# Patient Record
Sex: Female | Born: 1960 | Race: White | Hispanic: No | Smoking: Never smoker
Health system: Southern US, Community
[De-identification: ages and names within clinical notes are randomized; demographics above are authoritative.]

## PROBLEM LIST (undated history)

## (undated) DIAGNOSIS — I10 Essential (primary) hypertension: Secondary | ICD-10-CM

## (undated) DIAGNOSIS — G43909 Migraine, unspecified, not intractable, without status migrainosus: Secondary | ICD-10-CM

## (undated) DIAGNOSIS — H8109 Meniere's disease, unspecified ear: Secondary | ICD-10-CM

## (undated) DIAGNOSIS — J449 Chronic obstructive pulmonary disease, unspecified: Secondary | ICD-10-CM

## (undated) HISTORY — PX: TONSILLECTOMY: SUR1361

## (undated) HISTORY — PX: APPENDECTOMY: SHX54

---

## 2013-04-04 ENCOUNTER — Other Ambulatory Visit (HOSPITAL_COMMUNITY): Payer: Self-pay | Admitting: Family Medicine

## 2013-04-04 DIAGNOSIS — Z139 Encounter for screening, unspecified: Secondary | ICD-10-CM

## 2013-04-16 ENCOUNTER — Ambulatory Visit (HOSPITAL_COMMUNITY)
Admission: RE | Admit: 2013-04-16 | Discharge: 2013-04-16 | Disposition: A | Discharge status: Home / Self Care | Payer: BC Managed Care – PPO | Source: Ambulatory Visit | Attending: Family Medicine | Admitting: Family Medicine

## 2013-04-16 DIAGNOSIS — Z1231 Encounter for screening mammogram for malignant neoplasm of breast: Secondary | ICD-10-CM | POA: Insufficient documentation

## 2013-04-16 DIAGNOSIS — Z139 Encounter for screening, unspecified: Secondary | ICD-10-CM

## 2013-04-23 ENCOUNTER — Other Ambulatory Visit (HOSPITAL_COMMUNITY): Payer: Self-pay | Admitting: Family Medicine

## 2013-04-23 DIAGNOSIS — M7989 Other specified soft tissue disorders: Secondary | ICD-10-CM

## 2013-04-24 ENCOUNTER — Ambulatory Visit (HOSPITAL_COMMUNITY): Payer: BC Managed Care – PPO

## 2013-04-29 ENCOUNTER — Ambulatory Visit (HOSPITAL_COMMUNITY)
Admission: RE | Admit: 2013-04-29 | Discharge: 2013-04-29 | Disposition: A | Discharge status: Home / Self Care | Payer: BC Managed Care – PPO | Source: Ambulatory Visit | Attending: Family Medicine | Admitting: Family Medicine

## 2013-04-29 DIAGNOSIS — M7989 Other specified soft tissue disorders: Secondary | ICD-10-CM | POA: Insufficient documentation

## 2013-08-01 DIAGNOSIS — H8109 Meniere's disease, unspecified ear: Secondary | ICD-10-CM

## 2013-08-01 HISTORY — DX: Meniere's disease, unspecified ear: H81.09

## 2014-01-02 ENCOUNTER — Encounter (INDEPENDENT_AMBULATORY_CARE_PROVIDER_SITE_OTHER): Payer: BC Managed Care – PPO | Admitting: Ophthalmology

## 2014-01-02 DIAGNOSIS — H33309 Unspecified retinal break, unspecified eye: Secondary | ICD-10-CM

## 2014-01-02 DIAGNOSIS — I10 Essential (primary) hypertension: Secondary | ICD-10-CM

## 2014-01-02 DIAGNOSIS — H35419 Lattice degeneration of retina, unspecified eye: Secondary | ICD-10-CM

## 2014-01-02 DIAGNOSIS — H43819 Vitreous degeneration, unspecified eye: Secondary | ICD-10-CM

## 2014-01-02 DIAGNOSIS — H35039 Hypertensive retinopathy, unspecified eye: Secondary | ICD-10-CM

## 2014-01-09 ENCOUNTER — Encounter (INDEPENDENT_AMBULATORY_CARE_PROVIDER_SITE_OTHER): Payer: BC Managed Care – PPO | Admitting: Ophthalmology

## 2014-01-09 DIAGNOSIS — H33309 Unspecified retinal break, unspecified eye: Secondary | ICD-10-CM

## 2014-01-23 ENCOUNTER — Ambulatory Visit (INDEPENDENT_AMBULATORY_CARE_PROVIDER_SITE_OTHER): Payer: BC Managed Care – PPO | Admitting: Ophthalmology

## 2014-01-29 ENCOUNTER — Ambulatory Visit (INDEPENDENT_AMBULATORY_CARE_PROVIDER_SITE_OTHER): Payer: BC Managed Care – PPO | Admitting: Ophthalmology

## 2014-01-29 DIAGNOSIS — H33309 Unspecified retinal break, unspecified eye: Secondary | ICD-10-CM

## 2014-02-14 ENCOUNTER — Emergency Department (HOSPITAL_COMMUNITY): Payer: BC Managed Care – PPO

## 2014-02-14 ENCOUNTER — Encounter (HOSPITAL_COMMUNITY): Payer: Self-pay | Admitting: Emergency Medicine

## 2014-02-14 ENCOUNTER — Emergency Department (HOSPITAL_COMMUNITY)
Admission: EM | Admit: 2014-02-14 | Discharge: 2014-02-14 | Disposition: A | Discharge status: Home / Self Care | Payer: BC Managed Care – PPO | Attending: Emergency Medicine | Admitting: Emergency Medicine

## 2014-02-14 DIAGNOSIS — Z8669 Personal history of other diseases of the nervous system and sense organs: Secondary | ICD-10-CM | POA: Insufficient documentation

## 2014-02-14 DIAGNOSIS — R0602 Shortness of breath: Secondary | ICD-10-CM | POA: Insufficient documentation

## 2014-02-14 DIAGNOSIS — R42 Dizziness and giddiness: Secondary | ICD-10-CM | POA: Diagnosis present

## 2014-02-14 DIAGNOSIS — I10 Essential (primary) hypertension: Secondary | ICD-10-CM | POA: Insufficient documentation

## 2014-02-14 HISTORY — DX: Essential (primary) hypertension: I10

## 2014-02-14 HISTORY — DX: Migraine, unspecified, not intractable, without status migrainosus: G43.909

## 2014-02-14 LAB — CBC WITH DIFFERENTIAL/PLATELET
BASOS PCT: 1 % (ref 0–1)
Basophils Absolute: 0 10*3/uL (ref 0.0–0.1)
EOS ABS: 0.1 10*3/uL (ref 0.0–0.7)
EOS PCT: 2 % (ref 0–5)
HCT: 40.2 % (ref 36.0–46.0)
Hemoglobin: 13.2 g/dL (ref 12.0–15.0)
LYMPHS ABS: 1.4 10*3/uL (ref 0.7–4.0)
Lymphocytes Relative: 25 % (ref 12–46)
MCH: 27.4 pg (ref 26.0–34.0)
MCHC: 32.8 g/dL (ref 30.0–36.0)
MCV: 83.4 fL (ref 78.0–100.0)
Monocytes Absolute: 0.4 10*3/uL (ref 0.1–1.0)
Monocytes Relative: 7 % (ref 3–12)
NEUTROS PCT: 65 % (ref 43–77)
Neutro Abs: 3.7 10*3/uL (ref 1.7–7.7)
PLATELETS: 289 10*3/uL (ref 150–400)
RBC: 4.82 MIL/uL (ref 3.87–5.11)
RDW: 12.9 % (ref 11.5–15.5)
WBC: 5.6 10*3/uL (ref 4.0–10.5)

## 2014-02-14 LAB — COMPREHENSIVE METABOLIC PANEL
ALBUMIN: 4.3 g/dL (ref 3.5–5.2)
ALT: 17 U/L (ref 0–35)
ANION GAP: 13 (ref 5–15)
AST: 19 U/L (ref 0–37)
Alkaline Phosphatase: 151 U/L — ABNORMAL HIGH (ref 39–117)
BUN: 16 mg/dL (ref 6–23)
CALCIUM: 9.6 mg/dL (ref 8.4–10.5)
CO2: 28 mEq/L (ref 19–32)
Chloride: 100 mEq/L (ref 96–112)
Creatinine, Ser: 0.84 mg/dL (ref 0.50–1.10)
GFR calc Af Amer: >90 mL/min (ref >90)
GFR calc non Af Amer: 79 mL/min — ABNORMAL LOW (ref >90)
GLUCOSE: 110 mg/dL — AB (ref 70–99)
Potassium: 3.9 mEq/L (ref 3.7–5.3)
SODIUM: 141 meq/L (ref 137–147)
TOTAL PROTEIN: 7.5 g/dL (ref 6.0–8.3)
Total Bilirubin: 0.4 mg/dL (ref 0.3–1.2)

## 2014-02-14 LAB — TROPONIN I: Troponin I: <0.3 ng/mL (ref <0.30)

## 2014-02-14 MED ORDER — MECLIZINE HCL 12.5 MG PO TABS
25.0000 mg | ORAL_TABLET | Freq: Once | ORAL | Status: AC
  Administered 2014-02-14: 25 mg via ORAL
  Filled 2014-02-14: qty 2

## 2014-02-14 MED ORDER — MECLIZINE HCL 25 MG PO TABS: ORAL_TABLET | ORAL | Status: DC

## 2014-02-14 MED ORDER — SODIUM CHLORIDE 0.9 % IV BOLUS (SEPSIS)
500.0000 mL | Freq: Once | INTRAVENOUS | Status: AC
  Administered 2014-02-14: 500 mL via INTRAVENOUS

## 2014-02-14 NOTE — Discharge Instructions (Signed)
Follow up with your md next week for recheck °

## 2014-02-14 NOTE — ED Provider Notes (Signed)
CSN: [id number]     Arrival date & time 02/14/14  1614 History   First MD Initiated Contact with Patient 02/14/14 1622   This chart was scribed for Patricia Lennert, MD by Gwenevere Abbot, ED scribe. This patient was seen in room APA03/APA03 and the patient's care was started at 4:26 PM.     Chief Complaint  Patient presents with  . Dizziness   Patient is a 53 y.o. female presenting with dizziness. The history is provided by the patient. No language interpreter was used.  Dizziness Quality:  Room spinning Severity:  Severe Duration:  4 hours Context: standing up   Relieved by: sitting. Associated symptoms: no chest pain, no diarrhea and no headaches    HPI Comments:  Patricia Keith is a 53 y.o. female who presents to the Emergency Department complaining of dizziness with associated symptoms of SOB, onset 12:00PM. Pt also complains of intermitent numbness and tingling in both arms for the past two weeks. Pt complains that she feels like the room is spinning, but sitting down aides in relieving these symptoms. Pt reports that sometimes there is pain when she takes a deep breath. Pt reports that she has only experienced such dizziness when her BP is high. Pt states that she had laser eye surgery in the left and right eye 12/2013. Pt reports that she also has symptoms of allergies.  Past Medical History  Diagnosis Date  . Hypertension   . Migraines    Past Surgical History  Procedure Laterality Date  . Appendectomy    . Tonsillectomy     History reviewed. No pertinent family history. History  Substance Use Topics  . Smoking status: Never Smoker   . Smokeless tobacco: Not on file  . Alcohol Use: Yes   OB History   Grav Para Term Preterm Abortions TAB SAB Ect Mult Living                 Review of Systems  Constitutional: Negative for appetite change and fatigue.  HENT: Negative for congestion, ear discharge and sinus pressure.   Eyes: Negative for discharge.  Respiratory: Negative  for cough.   Cardiovascular: Negative for chest pain.  Gastrointestinal: Negative for abdominal pain and diarrhea.  Genitourinary: Negative for frequency and hematuria.  Musculoskeletal: Negative for back pain.  Skin: Negative for rash.  Neurological: Positive for dizziness. Negative for seizures and headaches.  Psychiatric/Behavioral: Negative for hallucinations.      Allergies  Review of patient's allergies indicates no known allergies.  Home Medications   Prior to Admission medications   Not on File   BP 148/75  Pulse 72  Temp(Src) 98.1 F (36.7 C) (Oral)  Resp 15  Ht 5\' 6"  (1.676 m)  Wt 175 lb (79.379 kg)  BMI 28.26 kg/m2  SpO2 100% Physical Exam  Constitutional: She is oriented to person, place, and time. She appears well-developed.  HENT:  Head: Normocephalic.  Eyes: Conjunctivae and EOM are normal. No scleral icterus.  Neck: Neck supple. No thyromegaly present.  Cardiovascular: Normal rate and regular rhythm.  Exam reveals no gallop and no friction rub.   No murmur heard. Pulmonary/Chest: No stridor. She has no wheezes. She has no rales. She exhibits no tenderness.  Abdominal: She exhibits no distension. There is no tenderness. There is no rebound.  Musculoskeletal: Normal range of motion. She exhibits no edema.  Lymphadenopathy:    She has no cervical adenopathy.  Neurological: She is oriented to person, place, and time. She  exhibits normal muscle tone. Coordination normal.  Skin: No rash noted. No erythema.  Psychiatric: She has a normal mood and affect. Her behavior is normal.    ED Course  Procedures  DIAGNOSTIC STUDIES: Oxygen Saturation is 100% on RA, normal by my interpretation.  COORDINATION OF CARE: 4:30 PM-Discussed treatment plan with pt at bedside and pt agreed to plan. Results for orders placed during the hospital encounter of 02/14/14  CBC WITH DIFFERENTIAL      Result Value Ref Range   WBC 5.6  4.0 - 10.5 K/uL   RBC 4.82  3.87 - 5.11  MIL/uL   Hemoglobin 13.2  12.0 - 15.0 g/dL   HCT 91.440.2  78.236.0 - 95.646.0 %   MCV 83.4  78.0 - 100.0 fL   MCH 27.4  26.0 - 34.0 pg   MCHC 32.8  30.0 - 36.0 g/dL   RDW 21.312.9  08.611.5 - 57.815.5 %   Platelets 289  150 - 400 K/uL   Neutrophils Relative % 65  43 - 77 %   Neutro Abs 3.7  1.7 - 7.7 K/uL   Lymphocytes Relative 25  12 - 46 %   Lymphs Abs 1.4  0.7 - 4.0 K/uL   Monocytes Relative 7  3 - 12 %   Monocytes Absolute 0.4  0.1 - 1.0 K/uL   Eosinophils Relative 2  0 - 5 %   Eosinophils Absolute 0.1  0.0 - 0.7 K/uL   Basophils Relative 1  0 - 1 %   Basophils Absolute 0.0  0.0 - 0.1 K/uL  COMPREHENSIVE METABOLIC PANEL      Result Value Ref Range   Sodium 141  137 - 147 mEq/L   Potassium 3.9  3.7 - 5.3 mEq/L   Chloride 100  96 - 112 mEq/L   CO2 28  19 - 32 mEq/L   Glucose, Bld 110 (*) 70 - 99 mg/dL   BUN 16  6 - 23 mg/dL   Creatinine, Ser 4.690.84  0.50 - 1.10 mg/dL   Calcium 9.6  8.4 - 62.910.5 mg/dL   Total Protein 7.5  6.0 - 8.3 g/dL   Albumin 4.3  3.5 - 5.2 g/dL   AST 19  0 - 37 U/L   ALT 17  0 - 35 U/L   Alkaline Phosphatase 151 (*) 39 - 117 U/L   Total Bilirubin 0.4  0.3 - 1.2 mg/dL   GFR calc non Af Amer 79 (*) >90 mL/min   GFR calc Af Amer >90  >90 mL/min   Anion gap 13  5 - 15  TROPONIN I      Result Value Ref Range   Troponin I <0.30  <0.30 ng/mL      EKG Interpretation   Date/Time:  Friday February 14 2014 16:29:28 EDT Ventricular Rate:  71 PR Interval:  176 QRS Duration: 85 QT Interval:  438 QTC Calculation: 476 R Axis:   75 Text Interpretation:  Sinus rhythm Low voltage, precordial leads Probable  anteroseptal infarct, old Confirmed by Roniqua Kintz  MD, Kaila Devries [phone]([id number]) on  02/14/2014 6:12:00 PM      MDM   Final diagnoses:  None   Vertigo,  tx with antivert  The chart was scribed for me under my direct supervision.  I personally performed the history, physical, and medical decision making and all procedures in the evaluation of this patient.Patricia Keith.    Kerianna Rawlinson L Laiah Pouncey, MD 02/14/14  [phone]

## 2014-02-14 NOTE — ED Notes (Signed)
Pt states at 1200 she started feeling dizzy, with SOB, nausea, pt also states for past few weeks both arms feel numb off and on. Denies chest pain. States her head is starting to hurt.

## 2014-05-15 ENCOUNTER — Ambulatory Visit (INDEPENDENT_AMBULATORY_CARE_PROVIDER_SITE_OTHER): Payer: BC Managed Care – PPO | Admitting: Otolaryngology

## 2014-05-15 DIAGNOSIS — R42 Dizziness and giddiness: Secondary | ICD-10-CM

## 2014-05-15 DIAGNOSIS — H8101 Meniere's disease, right ear: Secondary | ICD-10-CM

## 2014-05-20 ENCOUNTER — Other Ambulatory Visit (HOSPITAL_COMMUNITY): Payer: Self-pay | Admitting: Respiratory Therapy

## 2014-05-20 DIAGNOSIS — R0602 Shortness of breath: Secondary | ICD-10-CM

## 2014-06-02 ENCOUNTER — Ambulatory Visit (HOSPITAL_COMMUNITY)
Admission: RE | Admit: 2014-06-02 | Discharge: 2014-06-02 | Disposition: A | Discharge status: Home / Self Care | Payer: BC Managed Care – PPO | Source: Ambulatory Visit | Attending: Family Medicine | Admitting: Family Medicine

## 2014-06-02 ENCOUNTER — Ambulatory Visit (INDEPENDENT_AMBULATORY_CARE_PROVIDER_SITE_OTHER): Payer: BC Managed Care – PPO | Admitting: Ophthalmology

## 2014-06-02 DIAGNOSIS — H35033 Hypertensive retinopathy, bilateral: Secondary | ICD-10-CM

## 2014-06-02 DIAGNOSIS — R0602 Shortness of breath: Secondary | ICD-10-CM | POA: Diagnosis not present

## 2014-06-02 DIAGNOSIS — H33303 Unspecified retinal break, bilateral: Secondary | ICD-10-CM

## 2014-06-02 DIAGNOSIS — H43813 Vitreous degeneration, bilateral: Secondary | ICD-10-CM

## 2014-06-02 DIAGNOSIS — H35413 Lattice degeneration of retina, bilateral: Secondary | ICD-10-CM

## 2014-06-02 DIAGNOSIS — H2512 Age-related nuclear cataract, left eye: Secondary | ICD-10-CM

## 2014-06-02 DIAGNOSIS — I10 Essential (primary) hypertension: Secondary | ICD-10-CM

## 2014-06-02 LAB — PULMONARY FUNCTION TEST
DL/VA % pred: 83 %
DL/VA: 4.21 ml/min/mmHg/L
DLCO UNC % PRED: 70 %
DLCO unc: 18.92 ml/min/mmHg
FEF 25-75 PRE: 2.15 L/s
FEF 25-75 Post: 2.43 L/sec
FEF2575-%Change-Post: 12 %
FEF2575-%Pred-Post: 87 %
FEF2575-%Pred-Pre: 77 %
FEV1-%CHANGE-POST: 3 %
FEV1-%Pred-Post: 82 %
FEV1-%Pred-Pre: 80 %
FEV1-Post: 2.43 L
FEV1-Pre: 2.35 L
FEV1FVC-%Change-Post: 2 %
FEV1FVC-%Pred-Pre: 97 %
FEV6-%Change-Post: 1 %
FEV6-%PRED-PRE: 82 %
FEV6-%Pred-Post: 83 %
FEV6-POST: 3.04 L
FEV6-Pre: 3.01 L
FEV6FVC-%PRED-POST: 103 %
FEV6FVC-%PRED-PRE: 103 %
FVC-%Change-Post: 1 %
FVC-%PRED-PRE: 80 %
FVC-%Pred-Post: 81 %
FVC-POST: 3.04 L
FVC-PRE: 3.01 L
POST FEV6/FVC RATIO: 100 %
PRE FEV6/FVC RATIO: 100 %
Post FEV1/FVC ratio: 80 %
Pre FEV1/FVC ratio: 78 %
RV % pred: 114 %
RV: 2.22 L
TLC % PRED: 100 %
TLC: 5.38 L

## 2014-06-02 MED ORDER — ALBUTEROL SULFATE (2.5 MG/3ML) 0.083% IN NEBU
2.5000 mg | INHALATION_SOLUTION | Freq: Once | RESPIRATORY_TRACT | Status: AC
  Administered 2014-06-02: 2.5 mg via RESPIRATORY_TRACT

## 2014-06-12 ENCOUNTER — Ambulatory Visit (INDEPENDENT_AMBULATORY_CARE_PROVIDER_SITE_OTHER): Payer: BC Managed Care – PPO | Admitting: Otolaryngology

## 2014-06-12 DIAGNOSIS — H8103 Meniere's disease, bilateral: Secondary | ICD-10-CM

## 2014-06-12 DIAGNOSIS — H93293 Other abnormal auditory perceptions, bilateral: Secondary | ICD-10-CM

## 2014-07-29 ENCOUNTER — Ambulatory Visit (HOSPITAL_COMMUNITY)
Admission: RE | Admit: 2014-07-29 | Discharge: 2014-07-29 | Disposition: A | Discharge status: Home / Self Care | Payer: BC Managed Care – PPO | Source: Ambulatory Visit | Attending: Physician Assistant | Admitting: Physician Assistant

## 2014-07-29 ENCOUNTER — Other Ambulatory Visit (HOSPITAL_COMMUNITY): Payer: Self-pay | Admitting: Physician Assistant

## 2014-07-29 DIAGNOSIS — R059 Cough, unspecified: Secondary | ICD-10-CM

## 2014-07-29 DIAGNOSIS — R0600 Dyspnea, unspecified: Secondary | ICD-10-CM | POA: Insufficient documentation

## 2014-07-29 DIAGNOSIS — R05 Cough: Secondary | ICD-10-CM

## 2014-09-18 ENCOUNTER — Ambulatory Visit (INDEPENDENT_AMBULATORY_CARE_PROVIDER_SITE_OTHER): Payer: 59 | Admitting: Otolaryngology

## 2014-09-18 DIAGNOSIS — H8103 Meniere's disease, bilateral: Secondary | ICD-10-CM

## 2014-09-18 DIAGNOSIS — H903 Sensorineural hearing loss, bilateral: Secondary | ICD-10-CM

## 2015-06-08 ENCOUNTER — Ambulatory Visit (HOSPITAL_COMMUNITY)
Admission: RE | Admit: 2015-06-08 | Discharge: 2015-06-08 | Disposition: A | Discharge status: Home / Self Care | Payer: 59 | Source: Ambulatory Visit | Attending: Family Medicine | Admitting: Family Medicine

## 2015-06-08 ENCOUNTER — Other Ambulatory Visit (HOSPITAL_COMMUNITY): Payer: Self-pay | Admitting: Family Medicine

## 2015-06-08 DIAGNOSIS — R079 Chest pain, unspecified: Secondary | ICD-10-CM | POA: Insufficient documentation

## 2015-06-08 DIAGNOSIS — I10 Essential (primary) hypertension: Secondary | ICD-10-CM | POA: Insufficient documentation

## 2015-06-08 DIAGNOSIS — R0602 Shortness of breath: Secondary | ICD-10-CM | POA: Diagnosis present

## 2015-07-08 ENCOUNTER — Other Ambulatory Visit (HOSPITAL_COMMUNITY): Payer: Self-pay | Admitting: Family Medicine

## 2015-07-08 DIAGNOSIS — Z1231 Encounter for screening mammogram for malignant neoplasm of breast: Secondary | ICD-10-CM

## 2015-07-14 ENCOUNTER — Telehealth: Payer: Self-pay

## 2015-07-14 NOTE — Telephone Encounter (Signed)
Patient called to be set up for a colonoscopy. [facility] had sent a referral back in July 2016 and patient said she had too much going on at that time to have one done. She now wants to schedule procedure and wants it done before the end of the year. I told her that I doubt she would be scheduled before the end of the year unless there were cancellations off the schedule and we did try to schedule her back in July. Patient can be reached at [phone] if no answer she said to leave a message.

## 2015-07-14 NOTE — Telephone Encounter (Signed)
LMOM to call.

## 2015-07-15 ENCOUNTER — Telehealth: Payer: Self-pay

## 2015-07-16 NOTE — Telephone Encounter (Signed)
I have triaged pt. See separate triage. [facility] [employer] [employer] [facility].

## 2015-07-23 NOTE — Telephone Encounter (Signed)
Waiting on compass referral.

## 2015-07-29 NOTE — Telephone Encounter (Signed)
See note of 07/14/2015.

## 2015-07-29 NOTE — Telephone Encounter (Signed)
LMOM for a return call from pt. ( I never received any info on the compass referral). She had said that she would have [employer] after the first of the year.

## 2015-07-30 ENCOUNTER — Telehealth: Payer: Self-pay

## 2015-07-30 ENCOUNTER — Other Ambulatory Visit: Payer: Self-pay

## 2015-07-30 DIAGNOSIS — Z1211 Encounter for screening for malignant neoplasm of colon: Secondary | ICD-10-CM

## 2015-07-30 NOTE — Telephone Encounter (Signed)
Gastroenterology Pre-Procedure Review  Request Date: 07/30/2015 Requesting Physician: Dr. Hassan RowanKoberlein  PATIENT REVIEW QUESTIONS: The patient responded to the following health history questions as indicated:    This will be the first colonoscopy for pt.   1. Diabetes Melitis: no 2. Joint replacements in the past 12 months: no 3. Major health problems in the past 3 months: no 4. Has an artificial valve or MVP: no 5. Has a defibrillator: no 6. Has been advised in past to take antibiotics in advance of a procedure like teeth cleaning: no 7. Family history of colon cancer: no  8. Alcohol Use: no 9. History of sleep apnea: no     MEDICATIONS & ALLERGIES:    Patient reports the following regarding taking any blood thinners:   Plavix? no Aspirin? YES Coumadin? no  Patient confirms/reports the following medications:  Current Outpatient Prescriptions  Medication Sig Dispense Refill  . albuterol (PROVENTIL HFA;VENTOLIN HFA) 108 (90 BASE) MCG/ACT inhaler Inhale into the lungs every 6 (six) hours as needed for wheezing or shortness of breath.    Marland Kitchen. amLODipine (NORVASC) 10 MG tablet Take 10 mg by mouth daily.    Marland Kitchen. aspirin 81 MG tablet Take 81 mg by mouth daily.    . budesonide-formoterol (SYMBICORT) 160-4.5 MCG/ACT inhaler Inhale 2 puffs into the lungs 2 (two) times daily.    Marland Kitchen. escitalopram (LEXAPRO) 20 MG tablet Take 20 mg by mouth daily.    . fluticasone (FLONASE) 50 MCG/ACT nasal spray Place into both nostrils daily.    . furosemide (LASIX) 20 MG tablet Take 20 mg by mouth daily.    Marland Kitchen. LORazepam (ATIVAN) 0.5 MG tablet Take 0.5 mg by mouth 3 (three) times daily as needed. For anxiety   Usually takes once daily    . losartan (COZAAR) 100 MG tablet Take 100 mg by mouth daily.    . meclizine (ANTIVERT) 25 MG tablet Take one every 6 hours for dizziness 28 tablet 0  . LOTEMAX 0.5 % GEL Apply to eye. Reported on 07/30/2015     No current facility-administered medications for this visit.     Patient confirms/reports the following allergies:  No Known Allergies  No orders of the defined types were placed in this encounter.    AUTHORIZATION INFORMATION Primary Insurance:  ID #:  Group #:  Pre-Cert / Auth required:  Pre-Cert / Auth #:   Secondary Insurance: ID #:   Group #:  Pre-Cert / Auth required:  Pre-Cert / Auth #:   SCHEDULE INFORMATION: Procedure has been scheduled as follows:  Date:    08/31/2015     Time:  8:30 Am Location: [facility]  This Gastroenterology Pre-Precedure Review Form is being routed to the following provider(s): Jonette EvaSandi Fields, MD

## 2015-07-30 NOTE — Telephone Encounter (Signed)
See triage of 07/30/2015. Pt has her [employer] card now.

## 2015-07-31 NOTE — Telephone Encounter (Signed)
SUPREP SPLIT DOSING- CLEAR LIQUIDS WITH BREAKFAST.  

## 2015-08-05 MED ORDER — NA SULFATE-K SULFATE-MG SULF 17.5-3.13-1.6 GM/177ML PO SOLN: 1.0000 | ORAL | Status: DC

## 2015-08-05 NOTE — Telephone Encounter (Signed)
Rx sent to the pharmacy and instructions mailed to pt.  

## 2015-08-06 ENCOUNTER — Telehealth: Payer: Self-pay

## 2015-08-06 NOTE — Telephone Encounter (Signed)
I called [employer] @ [phone] and spoke to Yasmin B who said that a PA is not required for the screening colonoscopy.

## 2015-08-12 ENCOUNTER — Telehealth: Payer: Self-pay

## 2015-08-12 MED ORDER — PEG 3350-KCL-NA BICARB-NACL 420 G PO SOLR: 4000.0000 mL | ORAL | Status: DC

## 2015-08-12 NOTE — Telephone Encounter (Signed)
Suprep not covered by insurance and I have sent Rx for [employer] and mailing new instructions. LMOM for pt what I had to do.  Ask her to call me if she has questions.

## 2015-08-17 ENCOUNTER — Ambulatory Visit (HOSPITAL_COMMUNITY)
Admission: RE | Admit: 2015-08-17 | Discharge: 2015-08-17 | Disposition: A | Discharge status: Home / Self Care | Payer: BLUE CROSS/BLUE SHIELD | Source: Ambulatory Visit | Attending: Family Medicine | Admitting: Family Medicine

## 2015-08-17 DIAGNOSIS — Z1231 Encounter for screening mammogram for malignant neoplasm of breast: Secondary | ICD-10-CM | POA: Diagnosis present

## 2015-08-18 ENCOUNTER — Ambulatory Visit (HOSPITAL_COMMUNITY): Payer: BLUE CROSS/BLUE SHIELD

## 2015-08-31 ENCOUNTER — Ambulatory Visit (HOSPITAL_COMMUNITY)
Admission: RE | Admit: 2015-08-31 | Discharge: 2015-08-31 | Disposition: A | Discharge status: Home / Self Care | Payer: BLUE CROSS/BLUE SHIELD | Source: Ambulatory Visit | Attending: Gastroenterology | Admitting: Gastroenterology

## 2015-08-31 ENCOUNTER — Encounter (HOSPITAL_COMMUNITY)
Admission: RE | Disposition: A | Discharge status: Home / Self Care | Payer: Self-pay | Source: Ambulatory Visit | Attending: Gastroenterology

## 2015-08-31 ENCOUNTER — Encounter (HOSPITAL_COMMUNITY): Payer: Self-pay

## 2015-08-31 DIAGNOSIS — Z79899 Other long term (current) drug therapy: Secondary | ICD-10-CM | POA: Diagnosis not present

## 2015-08-31 DIAGNOSIS — I10 Essential (primary) hypertension: Secondary | ICD-10-CM | POA: Insufficient documentation

## 2015-08-31 DIAGNOSIS — K621 Rectal polyp: Secondary | ICD-10-CM | POA: Diagnosis not present

## 2015-08-31 DIAGNOSIS — Z7951 Long term (current) use of inhaled steroids: Secondary | ICD-10-CM | POA: Insufficient documentation

## 2015-08-31 DIAGNOSIS — K648 Other hemorrhoids: Secondary | ICD-10-CM | POA: Insufficient documentation

## 2015-08-31 DIAGNOSIS — Z1211 Encounter for screening for malignant neoplasm of colon: Secondary | ICD-10-CM | POA: Diagnosis not present

## 2015-08-31 DIAGNOSIS — Z7982 Long term (current) use of aspirin: Secondary | ICD-10-CM | POA: Diagnosis not present

## 2015-08-31 DIAGNOSIS — J449 Chronic obstructive pulmonary disease, unspecified: Secondary | ICD-10-CM | POA: Diagnosis not present

## 2015-08-31 DIAGNOSIS — K5730 Diverticulosis of large intestine without perforation or abscess without bleeding: Secondary | ICD-10-CM | POA: Diagnosis not present

## 2015-08-31 HISTORY — DX: Chronic obstructive pulmonary disease, unspecified: J44.9

## 2015-08-31 HISTORY — PX: COLONOSCOPY: SHX5424

## 2015-08-31 HISTORY — DX: Meniere's disease, unspecified ear: H81.09

## 2015-08-31 SURGERY — COLONOSCOPY
Anesthesia: Moderate Sedation

## 2015-08-31 MED ORDER — MEPERIDINE HCL 100 MG/ML IJ SOLN
INTRAMUSCULAR | Status: DC | PRN
  Administered 2015-08-31: 25 mg via INTRAVENOUS
  Administered 2015-08-31: 50 mg via INTRAVENOUS
  Administered 2015-08-31: 25 mg via INTRAVENOUS

## 2015-08-31 MED ORDER — MIDAZOLAM HCL 5 MG/5ML IJ SOLN
INTRAMUSCULAR | Status: AC
  Filled 2015-08-31: qty 10

## 2015-08-31 MED ORDER — STERILE WATER FOR IRRIGATION IR SOLN
Status: DC | PRN
  Administered 2015-08-31: 08:00:00

## 2015-08-31 MED ORDER — MIDAZOLAM HCL 5 MG/5ML IJ SOLN
INTRAMUSCULAR | Status: DC | PRN
  Administered 2015-08-31 (×3): 2 mg via INTRAVENOUS

## 2015-08-31 MED ORDER — MEPERIDINE HCL 100 MG/ML IJ SOLN
INTRAMUSCULAR | Status: AC
  Filled 2015-08-31: qty 2

## 2015-08-31 MED ORDER — SODIUM CHLORIDE 0.9 % IV SOLN
INTRAVENOUS | Status: DC
  Administered 2015-08-31: 08:00:00 via INTRAVENOUS

## 2015-08-31 NOTE — Discharge Instructions (Signed)
You had 1 small polyp removed. You have small internal hemorrhoids and diverticulosis IN YOUR LEFT COLON.   DRINK WATER TO KEEP YOUR URINE LIGHT YELLOW.  FOLLOW A HIGH FIBER DIET. AVOID ITEMS THAT CAUSE BLOATING. SEE INFO BELOW.  YOUR BIOPSY RESULTS WILL BE AVAILABLE IN MY CHART FEB 2 AND MY OFFICE WILL CONTACT YOU IN 10-14 DAYS WITH YOUR RESULTS.   Next colonoscopy in 5-10 years.   Colonoscopy Care After Read the instructions outlined below and refer to this sheet in the next week. These discharge instructions provide you with general information on caring for yourself after you leave the hospital. While your treatment has been planned according to the most current medical practices available, unavoidable complications occasionally occur. If you have any problems or questions after discharge, call DR. Ariela Mochizuki, [phone].  ACTIVITY  You may resume your regular activity, but move at a slower pace for the next 24 hours.   Take frequent rest periods for the next 24 hours.   Walking will help get rid of the air and reduce the bloated feeling in your belly (abdomen).   No driving for 24 hours (because of the medicine (anesthesia) used during the test).   You may shower.   Do not sign any important legal documents or operate any machinery for 24 hours (because of the anesthesia used during the test).    NUTRITION  Drink plenty of fluids.   You may resume your normal diet as instructed by your doctor.   Begin with a light meal and progress to your normal diet. Heavy or fried foods are harder to digest and may make you feel sick to your stomach (nauseated).   Avoid alcoholic beverages for 24 hours or as instructed.    MEDICATIONS  You may resume your normal medications.   WHAT YOU CAN EXPECT TODAY  Some feelings of bloating in the abdomen.   Passage of more gas than usual.   Spotting of blood in your stool or on the toilet paper  .  IF YOU HAD POLYPS REMOVED DURING  THE COLONOSCOPY:  Eat a soft diet IF YOU HAVE NAUSEA, BLOATING, ABDOMINAL PAIN, OR VOMITING.    FINDING OUT THE RESULTS OF YOUR TEST Not all test results are available during your visit. DR. Darrick Penna WILL CALL YOU WITHIN 14 DAYS OF YOUR PROCEDUE WITH YOUR RESULTS. Do not assume everything is normal if you have not heard from DR. Briyanna Billingham, CALL HER OFFICE AT [phone].  SEEK IMMEDIATE MEDICAL ATTENTION AND CALL THE OFFICE: [phone] IF:  You have more than a spotting of blood in your stool.   Your belly is swollen (abdominal distention).   You are nauseated or vomiting.   You have a temperature over 101F.   You have abdominal pain or discomfort that is severe or gets worse throughout the day.  Polyps, Colon  A polyp is extra tissue that grows inside your body. Colon polyps grow in the large intestine. The large intestine, also called the colon, is part of your digestive system. It is a long, hollow tube at the end of your digestive tract where your body makes and stores stool. Most polyps are not dangerous. They are benign. This means they are not cancerous. But over time, some types of polyps can turn into cancer. Polyps that are smaller than a pea are usually not harmful. But larger polyps could someday become or may already be cancerous. To be safe, doctors remove all polyps and test them.   PREVENTION  There is not one sure way to prevent polyps. You might be able to lower your risk of getting them if you:  Eat more fruits and vegetables and less fatty food.   Do not smoke.   Avoid alcohol.   Exercise every day.   Lose weight if you are overweight.   Eating more calcium and folate can also lower your risk of getting polyps. Some foods that are rich in calcium are milk, cheese, and broccoli. Some foods that are rich in folate are chickpeas, kidney beans, and spinach.   High-Fiber Diet A high-fiber diet changes your normal diet to include more whole grains, legumes, fruits,  and vegetables. Changes in the diet involve replacing refined carbohydrates with unrefined foods. The calorie level of the diet is essentially unchanged. The Dietary Reference Intake (recommended amount) for adult males is 38 grams per day. For adult females, it is 25 grams per day. Pregnant and lactating women should consume 28 grams of fiber per day. Fiber is the intact part of a plant that is not broken down during digestion. Functional fiber is fiber that has been isolated from the plant to provide a beneficial effect in the body. PURPOSE  Increase stool bulk.   Ease and regulate bowel movements.   Lower cholesterol.   REDUCE RISK OF COLON CANCER  INDICATIONS THAT YOU NEED MORE FIBER  Constipation and hemorrhoids.   Uncomplicated diverticulosis (intestine condition) and irritable bowel syndrome.   Weight management.   As a protective measure against hardening of the arteries (atherosclerosis), diabetes, and cancer.   GUIDELINES FOR INCREASING FIBER IN THE DIET  Start adding fiber to the diet slowly. A gradual increase of about 5 more grams (2 slices of whole-wheat bread, 2 servings of most fruits or vegetables, or 1 bowl of high-fiber cereal) per day is best. Too rapid an increase in fiber may result in constipation, flatulence, and bloating.   Drink enough water and fluids to keep your urine clear or pale yellow. Water, juice, or caffeine-free drinks are recommended. Not drinking enough fluid may cause constipation.   Eat a variety of high-fiber foods rather than one type of fiber.   Try to increase your intake of fiber through using high-fiber foods rather than fiber pills or supplements that contain small amounts of fiber.   The goal is to change the types of food eaten. Do not supplement your present diet with high-fiber foods, but replace foods in your present diet.   INCLUDE A VARIETY OF FIBER SOURCES  Replace refined and processed grains with whole grains, canned fruits  with fresh fruits, and incorporate other fiber sources. White rice, white breads, and most bakery goods contain little or no fiber.   Brown whole-grain rice, buckwheat oats, and many fruits and vegetables are all good sources of fiber. These include: broccoli, Brussels sprouts, cabbage, cauliflower, beets, sweet potatoes, white potatoes (skin on), carrots, tomatoes, eggplant, squash, berries, fresh fruits, and dried fruits.   Cereals appear to be the richest source of fiber. Cereal fiber is found in whole grains and bran. Bran is the fiber-rich outer coat of cereal grain, which is largely removed in refining. In whole-grain cereals, the bran remains. In breakfast cereals, the largest amount of fiber is found in those with "bran" in their names. The fiber content is sometimes indicated on the label.   You may need to include additional fruits and vegetables each day.   In baking, for 1 cup white flour, you may use the following  substitutions:   1 cup whole-wheat flour minus 2 tablespoons.   1/2 cup white flour plus 1/2 cup whole-wheat flour.   Diverticulosis Diverticulosis is a common condition that develops when small pouches (diverticula) form in the wall of the colon. The risk of diverticulosis increases with age. It happens more often in people who eat a low-fiber diet. Most individuals with diverticulosis have no symptoms. Those individuals with symptoms usually experience belly (abdominal) pain, constipation, or loose stools (diarrhea).  HOME CARE INSTRUCTIONS  Increase the amount of fiber in your diet as directed by your caregiver or dietician. This may reduce symptoms of diverticulosis.   Drink at least 6 to 8 glasses of water each day to prevent constipation.   Try not to strain when you have a bowel movement.   Avoiding nuts and seeds to prevent complications is NOT NECESSARY.   FOODS HAVING HIGH FIBER CONTENT INCLUDE:  Fruits. Apple, peach, pear, tangerine, raisins, prunes.    Vegetables. Brussels sprouts, asparagus, broccoli, cabbage, carrot, cauliflower, romaine lettuce, spinach, summer squash, tomato, winter squash, zucchini.   Starchy Vegetables. Baked beans, kidney beans, lima beans, split peas, lentils, potatoes (with skin).   Grains. Whole wheat bread, brown rice, bran flake cereal, plain oatmeal, white rice, shredded wheat, bran muffins.   SEEK IMMEDIATE MEDICAL CARE IF:  You develop increasing pain or severe bloating.   You have an oral temperature above 101F.   You develop vomiting or bowel movements that are bloody or black.   Hemorrhoids Hemorrhoids are dilated (enlarged) veins around the rectum. Sometimes clots will form in the veins. This makes them swollen and painful. These are called thrombosed hemorrhoids. Causes of hemorrhoids include:  Constipation.   Straining to have a bowel movement.   HEAVY LIFTING  HOME CARE INSTRUCTIONS  Eat a well balanced diet and drink 6 to 8 glasses of water every day to avoid constipation. You may also use a bulk laxative.   Avoid straining to have bowel movements.   Keep anal area dry and clean.   Do not use a donut shaped pillow or sit on the toilet for long periods. This increases blood pooling and pain.   Move your bowels when your body has the urge; this will require less straining and will decrease pain and pressure.

## 2015-08-31 NOTE — H&P (Signed)
Primary Care Physician:  Collene Mares, PA-C Primary Gastroenterologist:  Dr. Oneida Alar  Pre-Procedure History & Physical: HPI:  Patricia Keith is a 54 y.o. female here for [facility].  Past Medical History  Diagnosis Date  . Hypertension   . Migraines   . COPD (chronic obstructive pulmonary disease) ([place])     DX 2015  . Meniere disease 2015    Past Surgical History  Procedure Laterality Date  . Appendectomy    . Tonsillectomy      Prior to Admission medications   Medication Sig Start Date End Date Taking? Authorizing Provider  albuterol (PROVENTIL HFA;VENTOLIN HFA) 108 (90 BASE) MCG/ACT inhaler Inhale into the lungs every 6 (six) hours as needed for wheezing or shortness of breath.   Yes Historical Provider, MD  amLODipine (NORVASC) 10 MG tablet Take 10 mg by mouth daily. 02/08/14  Yes Historical Provider, MD  aspirin 81 MG tablet Take 81 mg by mouth daily.   Yes Historical Provider, MD  budesonide-formoterol (SYMBICORT) 160-4.5 MCG/ACT inhaler Inhale 2 puffs into the lungs 2 (two) times daily.   Yes Historical Provider, MD  Cholecalciferol (VITAMIN D PO) Take 1 capsule by mouth daily.   Yes Historical Provider, MD  diazepam (VALIUM) 2 MG tablet Take 2 mg by mouth every 6 (six) hours as needed for anxiety.   Yes Historical Provider, MD  escitalopram (LEXAPRO) 20 MG tablet Take 20 mg by mouth daily. 02/08/14  Yes Historical Provider, MD  fluticasone (FLONASE) 50 MCG/ACT nasal spray Place into both nostrils daily.   Yes Historical Provider, MD  furosemide (LASIX) 20 MG tablet Take 20 mg by mouth daily. 02/08/14  Yes Historical Provider, MD  LORazepam (ATIVAN) 0.5 MG tablet Take 0.5 mg by mouth 3 (three) times daily as needed. For anxiety   Usually takes once daily 02/10/14  Yes Historical Provider, MD  losartan (COZAAR) 100 MG tablet Take 100 mg by mouth daily. 02/08/14  Yes Historical Provider, MD  meclizine (ANTIVERT) 25 MG tablet Take one every 6 hours for dizziness 02/14/14   Yes Milton Ferguson, MD  Na Sulfate-K Sulfate-Mg Sulf (SUPREP BOWEL PREP) SOLN Take 1 kit by mouth as directed. 08/05/15  Yes Danie Binder, MD  Omega-3 Fatty Acids (FISH OIL PO) Take 1 capsule by mouth daily.   Yes Historical Provider, MD  polyethylene glycol-electrolytes (TRILYTE) 420 g solution Take 4,000 mLs by mouth as directed. 08/12/15  Yes Danie Binder, MD  thiamine (VITAMIN B-1) 100 MG tablet Take 100 mg by mouth daily.   Yes Historical Provider, MD    Allergies as of 07/30/2015  . (No Known Allergies)    History reviewed. No pertinent family history.  Social History   Social History  . Marital Status: Divorced    Spouse Name: N/A  . Number of Children: N/A  . Years of Education: N/A   Occupational History  . Not on file.   Social History Main Topics  . Smoking status: Never Smoker   . Smokeless tobacco: Not on file  . Alcohol Use: Yes  . Drug Use: No  . Sexual Activity: Not on file   Other Topics Concern  . Not on file   Social History Narrative    Review of Systems: See HPI, otherwise negative ROS   Physical Exam: BP 147/74 mmHg  Pulse 67  Temp(Src) 98.2 F (36.8 C) (Oral)  Resp 15  Ht '5\' 6"'  (1.676 m)  Wt 168 lb (76.204 kg)  BMI 27.13 kg/m2  SpO2 98% General:   Alert,  pleasant and cooperative in NAD Head:  Normocephalic and atraumatic. Neck:  Supple; Lungs:  Clear throughout to auscultation.    Heart:  Regular rate and rhythm. Abdomen:  Soft, nontender and nondistended. Normal bowel sounds, without guarding, and without rebound.   Neurologic:  Alert and  oriented x4;  grossly normal neurologically.  Impression/Plan:     SCREENING  Plan:  1. TCS TODAY

## 2015-08-31 NOTE — Op Note (Signed)
[facility] [address] [place], [address]   COLONOSCOPY PROCEDURE REPORT  PATIENT: Patricia, Keith  MR#: [id number] BIRTHDATE: 1960/12/15 , 54  yrs. old GENDER: female ENDOSCOPIST: West Bali, MD REFERRED KG:[id number] Koberlein, M.D. PROCEDURE DATE:  2015/09/28 PROCEDURE:   Colonoscopy with cold biopsy polypectomy INDICATIONS:average risk patient for colon cancer. MEDICATIONS: Demerol 100 mg IV and Versed 6 mg IV  MD INITIATED SEDATION: 0827. PROCEDURE END: 0854  DESCRIPTION OF PROCEDURE:    Physical exam was performed.  Informed consent was obtained from the patient after explaining the benefits, risks, and alternatives to procedure.  The patient was connected to monitor and placed in left lateral position. Continuous oxygen was provided by nasal cannula and IV medicine administered through an indwelling cannula.  After administration of sedation and rectal exam, the patients rectum was intubated and the EC-[id number] ([id number])  colonoscope was advanced under direct visualization to the cecum.  The scope was removed slowly by carefully examining the color, texture, anatomy, and integrity mucosa on the way out.  The patient was recovered in endoscopy and discharged home in satisfactory condition. Estimated blood loss is zero unless otherwise noted in this procedure report.    COLON FINDINGS: A sessile polyp measuring 3 mm in size was found in the rectum.  A polypectomy was performed with cold forceps.  , There was mild diverticulosis noted in the sigmoid colon with associated muscular hypertrophy.  , and Small internal hemorrhoids were found.  PREP QUALITY: excellent.  CECAL W/D TIME: 10       minutes COMPLICATIONS: None  ENDOSCOPIC IMPRESSION: 1.   ONE RECTAL polyp REMOVED 2.   Mild diverticulosis in the sigmoid colon 3.   Small internal hemorrhoids  RECOMMENDATIONS: DRINK WATER TO KEEP URINE LIGHT YELLOW. FOLLOW A HIGH FIBER DIET. AWAIT BIOPSY  RESULTS. Next colonoscopy in 5-10 years.     _______________________________ eSignedWest Bali, MD 2015/09/28 11:38 AM    CPT CODES: ICD CODES:  The ICD and CPT codes recommended by this software are interpretations from the data that the clinical staff has captured with the software.  The verification of the translation of this report to the ICD and CPT codes and modifiers is the sole responsibility of the health care institution and practicing physician where this report was generated.  [employer] will not be held responsible for the validity of the ICD and CPT codes included on this report.  AMA assumes no liability for data contained or not contained herein. CPT is a Publishing rights manager of the [employer].

## 2015-09-02 ENCOUNTER — Other Ambulatory Visit (HOSPITAL_COMMUNITY): Payer: Self-pay | Admitting: Family Medicine

## 2015-09-02 ENCOUNTER — Encounter (HOSPITAL_COMMUNITY): Payer: Self-pay | Admitting: Gastroenterology

## 2015-09-02 DIAGNOSIS — R748 Abnormal levels of other serum enzymes: Secondary | ICD-10-CM

## 2015-09-10 ENCOUNTER — Ambulatory Visit (HOSPITAL_COMMUNITY)
Admission: RE | Admit: 2015-09-10 | Discharge: 2015-09-10 | Disposition: A | Discharge status: Home / Self Care | Payer: BLUE CROSS/BLUE SHIELD | Source: Ambulatory Visit | Attending: Family Medicine | Admitting: Family Medicine

## 2015-09-10 DIAGNOSIS — N289 Disorder of kidney and ureter, unspecified: Secondary | ICD-10-CM | POA: Insufficient documentation

## 2015-09-10 DIAGNOSIS — R748 Abnormal levels of other serum enzymes: Secondary | ICD-10-CM | POA: Diagnosis present

## 2015-09-10 DIAGNOSIS — K7689 Other specified diseases of liver: Secondary | ICD-10-CM | POA: Diagnosis not present

## 2015-09-14 ENCOUNTER — Telehealth: Payer: Self-pay | Admitting: Gastroenterology

## 2015-09-14 NOTE — Telephone Encounter (Signed)
Please call pt. She had a polypoid lesion, removed and it was benign.   DRINK WATER TO KEEP YOUR URINE LIGHT YELLOW.  FOLLOW A HIGH FIBER DIET. AVOID ITEMS THAT CAUSE BLOATING.  Next colonoscopy in 10 years.

## 2015-09-15 NOTE — Telephone Encounter (Signed)
Reminder in epic °

## 2015-09-15 NOTE — Telephone Encounter (Signed)
Tried to call with no answer  

## 2015-09-16 NOTE — Telephone Encounter (Signed)
Pt called and is aware of results.  

## 2015-09-16 NOTE — Telephone Encounter (Signed)
LMOM to call. Letter mailed to call also.  

## 2015-10-22 ENCOUNTER — Encounter: Payer: Self-pay | Admitting: Nurse Practitioner

## 2015-10-22 ENCOUNTER — Ambulatory Visit (INDEPENDENT_AMBULATORY_CARE_PROVIDER_SITE_OTHER): Payer: BLUE CROSS/BLUE SHIELD | Admitting: Nurse Practitioner

## 2015-10-22 VITALS — BP 134/80 | HR 79 | Temp 97.5°F | Ht 67.0 in | Wt 173.8 lb

## 2015-10-22 DIAGNOSIS — R748 Abnormal levels of other serum enzymes: Secondary | ICD-10-CM | POA: Diagnosis not present

## 2015-10-22 DIAGNOSIS — R935 Abnormal findings on diagnostic imaging of other abdominal regions, including retroperitoneum: Secondary | ICD-10-CM | POA: Diagnosis not present

## 2015-10-22 LAB — HEPATIC FUNCTION PANEL
ALK PHOS: 123 U/L (ref 33–130)
ALT: 17 U/L (ref 6–29)
AST: 17 U/L (ref 10–35)
Albumin: 4.1 g/dL (ref 3.6–5.1)
BILIRUBIN INDIRECT: 0.4 mg/dL (ref 0.2–1.2)
BILIRUBIN TOTAL: 0.5 mg/dL (ref 0.2–1.2)
Bilirubin, Direct: 0.1 mg/dL (ref <=0.2)
Total Protein: 6.9 g/dL (ref 6.1–8.1)

## 2015-10-22 LAB — GAMMA GT: GGT: 17 U/L (ref 7–51)

## 2015-10-22 NOTE — Assessment & Plan Note (Signed)
Patient with an abnormal abdominal ultrasound completed in February of this year with a small cyst in the inferior margin of the left lobe which was minimally complicated. [facility] found that cyst as well as an additional cyst, both appear to be fluid filled and minimally complicated. The patient has brought her imaging disc with her and I will review this with the radiologist for their impression and recommendations. These likely represent benign fluid-filled cysts which we'll just need to be periodically followed on imaging. Further recommendations based on radiology impression. Return for follow-up in 3 months.

## 2015-10-22 NOTE — Progress Notes (Signed)
Primary Care Physician:  Colette RibasGOLDING, JOHN CABOT, MD Primary Gastroenterologist:  Dr. Darrick PennaFields  Chief Complaint  Patient presents with  . Liver Cysts    HPI:   Patricia Keith is a 55 y.o. female who presents For evaluation of hepatic cysts. Colonoscopy up-to-date as completed 08/31/2015 with single rectal polyp found to be benign colonic mucosa with lymphoid aggregate, recommend repeat colonoscopy in 5-10 years. Patient referred by primary care for 6 month elevation of alkaline phosphatase status post abdominal ultrasound. The ultrasound was completed on 09/10/2015 and found liver with normal echogenicity, small cyst in the inferior margin of the left lobe measuring 11 x 12 x 14 mm which is minimally complicated, common bile duct diameter normal at 5 mm. Overall impression small minimally, located cyst inferior margin left lobe liver with few scattered low-level internal echoes.  Today she states she did have an ultrasound. Subsequently [facility]. She has the CT imaging on disc with her today. The report was pulled from Care Everywhere and appears to be fluid-filled cyst. Denies abdominal pain, N/V, yellowing of skin or eyes, darkened urine, acute episodic confusion. Denies chest pain, dyspnea, dizziness, lightheadedness, syncope, near syncope. Denies any other upper or lower GI symptoms.  Past Medical History  Diagnosis Date  . Hypertension   . Migraines   . COPD (chronic obstructive pulmonary disease) (HCC)     DX 2015  . Meniere disease 2015    Past Surgical History  Procedure Laterality Date  . Appendectomy    . Tonsillectomy    . Colonoscopy N/A 08/31/2015    SLF: 1. one rectal polyp removed  2. mild diverticulosis in the sigmoid colon 3. small internal hemorrhoids      Current Outpatient Prescriptions  Medication Sig Dispense Refill  . albuterol (PROVENTIL HFA;VENTOLIN HFA) 108 (90 BASE) MCG/ACT inhaler Inhale into the lungs every 6 (six) hours as needed for  wheezing or shortness of breath.    Marland Kitchen. amLODipine (NORVASC) 10 MG tablet Take 10 mg by mouth daily.    Marland Kitchen. aspirin 81 MG tablet Take 81 mg by mouth daily.    . budesonide-formoterol (SYMBICORT) 160-4.5 MCG/ACT inhaler Inhale 2 puffs into the lungs 2 (two) times daily.    . Cholecalciferol (VITAMIN D PO) Take 1 capsule by mouth daily.    . diazepam (VALIUM) 2 MG tablet Take 2 mg by mouth every 6 (six) hours as needed for anxiety.    Marland Kitchen. escitalopram (LEXAPRO) 20 MG tablet Take 20 mg by mouth daily.    . fluticasone (FLONASE) 50 MCG/ACT nasal spray Place into both nostrils daily.    . furosemide (LASIX) 20 MG tablet Take 20 mg by mouth daily.    Marland Kitchen. LORazepam (ATIVAN) 0.5 MG tablet Take 0.5 mg by mouth 3 (three) times daily as needed. For anxiety   Usually takes once daily    . losartan (COZAAR) 100 MG tablet Take 100 mg by mouth daily.    . meclizine (ANTIVERT) 25 MG tablet Take one every 6 hours for dizziness 28 tablet 0  . NON FORMULARY Ferrous Sulfate   One tablet daily    . Omega-3 Fatty Acids (FISH OIL PO) Take 1 capsule by mouth daily.    Marland Kitchen. thiamine (VITAMIN B-1) 100 MG tablet Take 100 mg by mouth daily.     No current facility-administered medications for this visit.    Allergies as of 10/22/2015  . (No Known Allergies)    No family history  on file.  Social History   Social History  . Marital Status: Divorced    Spouse Name: N/A  . Number of Children: N/A  . Years of Education: N/A   Occupational History  . Not on file.   Social History Main Topics  . Smoking status: Never Smoker   . Smokeless tobacco: Not on file     Comment: Never smoked  . Alcohol Use: 0.0 oz/week    0 Standard drinks or equivalent per week  . Drug Use: No  . Sexual Activity: Not on file   Other Topics Concern  . Not on file   Social History Narrative    Review of Systems: 10-point ROS negative except as per HPI.    Physical Exam: BP 134/80 mmHg  Pulse 79  Temp(Src) 97.5 F (36.4 C)  (Oral)  Ht  (1.702 m)  Wt 173 lb 12.8 oz (78.835 kg)  BMI 27.21 kg/m2 General:   Alert and oriented. Pleasant and cooperative. Well-nourished and well-developed.  Head:  Normocephalic and atraumatic. Eyes:  Without icterus, sclera clear and conjunctiva pink.  Ears:  Normal auditory acuity. Cardiovascular:  S1, S2 present without murmurs appreciated. Extremities without clubbing or edema. Respiratory:  Clear to auscultation bilaterally. No wheezes, rales, or rhonchi. No distress.  Gastrointestinal:  +BS, soft, non-tender and non-distended. No HSM noted. No guarding or rebound. No masses appreciated.  Rectal:  Deferred  Musculoskalatal:  Symmetrical without gross deformities. Skin:  Intact without significant lesions or rashes. Neurologic:  Alert and oriented x4;  grossly normal neurologically. Psych:  Alert and cooperative. Normal mood and affect. Heme/Lymph/Immune: No excessive bruising noted.    10/22/2015 10:57 AM   Disclaimer: This note was dictated with voice recognition software. Similar sounding words can inadvertently be transcribed and may not be corrected upon review.

## 2015-10-22 NOTE — Patient Instructions (Signed)
1. Have your bloodwork done when you're able to 2. Return for follow-up in 3 months

## 2015-10-22 NOTE — Assessment & Plan Note (Signed)
Patient with an isolated elevated alkaline phosphatase noted middle of last year with alkaline phosphatase 151, otherwise normal liver function tests including AST/ALT, bilirubin. Do not have any subsequent labs done by primary care. At this point I'll recheck a hepatic function panel and GGT to confirm elevated alkaline phosphatase is hepatic in origin given isolated mild elevation with no other liver enzyme abnormalities. Return for follow-up in 3 months.

## 2015-10-22 NOTE — Progress Notes (Signed)
cc'ed to pcp °

## 2015-10-28 NOTE — Progress Notes (Signed)
Quick Note:  LMOM to call. ______ 

## 2015-10-28 NOTE — Progress Notes (Signed)
Quick Note:  Pt is aware. ______ 

## 2015-11-23 ENCOUNTER — Encounter: Payer: Self-pay | Admitting: Internal Medicine

## 2015-11-23 ENCOUNTER — Ambulatory Visit (INDEPENDENT_AMBULATORY_CARE_PROVIDER_SITE_OTHER): Payer: BLUE CROSS/BLUE SHIELD | Admitting: Internal Medicine

## 2015-11-23 VITALS — BP 126/82 | HR 75 | Ht 66.0 in | Wt 175.4 lb

## 2015-11-23 DIAGNOSIS — R918 Other nonspecific abnormal finding of lung field: Secondary | ICD-10-CM | POA: Diagnosis not present

## 2015-11-23 DIAGNOSIS — J45991 Cough variant asthma: Secondary | ICD-10-CM | POA: Insufficient documentation

## 2015-11-23 MED ORDER — FAMOTIDINE 20 MG PO TABS: ORAL_TABLET | ORAL | Status: DC

## 2015-11-23 MED ORDER — PANTOPRAZOLE SODIUM 40 MG PO TBEC: 40.0000 mg | DELAYED_RELEASE_TABLET | Freq: Every day | ORAL | Status: DC

## 2015-11-23 MED ORDER — BUDESONIDE-FORMOTEROL FUMARATE 80-4.5 MCG/ACT IN AERO: INHALATION_SPRAY | RESPIRATORY_TRACT | Status: DC

## 2015-11-23 NOTE — Assessment & Plan Note (Signed)
See CT [facility]:  All 4 mm or less/ never smoker > no f/u needed   CT results reviewed with pt >>> Too small for PET or bx, not suspicious enough for excisional bx >  By [employer] guidelines she is very low risk and no dedicated f/u indicated unless needed to address unresolved or new chest symptoms

## 2015-11-23 NOTE — Assessment & Plan Note (Signed)
Symptoms are markedly disproportionate to objective findings and not clear this is a lung problem but pt does appear to have difficult airway management issues. DDX of  difficult airways management almost all start with A and  include Adherence, Ace Inhibitors, Acid Reflux, Active Sinus Disease, Alpha 1 Antitripsin deficiency, Anxiety masquerading as Airways dz,  ABPA,  Allergy(esp in young), Aspiration (esp in elderly), Adverse effects of meds,  Active smokers, A bunch of PE's (a small clot burden can't cause this syndrome unless there is already severe underlying pulm or vascular dz with poor reserve) plus two Bs  = Bronchiectasis and Beta blocker use..and one C= CHF   Adherence is always the initial "prime suspect" and is a multilayered concern that requires a "trust but verify" approach in every patient - starting with knowing how to use medications, especially inhalers, correctly, keeping up with refills and understanding the fundamental difference between maintenance and prns vs those medications only taken for a very short course and then stopped and not refilled.  - - The proper method of use, as well as anticipated side effects, of a metered-dose inhaler are discussed and demonstrated to the patient. Improved effectiveness after extensive coaching during this visit to a level of approximately 90 % from a baseline of 75 %    ? Acid (or non-acid) GERD > always difficult to exclude as up to 75% of pts in some series report no assoc GI/ Heartburn symptoms> rec max (24h)  acid suppression and diet restrictions/ reviewed and instructions given in writing.   ? Allergy > hold off on testing until returns for full pfts  ? Adverse effect of meds > high dose ICS may paradoxically cause cough and sob from uacs so try symbicort 80 2bid  ? ACEi effects > For reasons that may related to vascular permability and nitric oxide pathways but not elevated  bradykinin levels (as seen with  ACEi use) losartan in the  generic form has been reported now from mulitple sources  to cause a similar pattern of non-specific  upper airway symptoms as seen with acei.   This has not been reported with exposure to the other ARB's to date, so may need  to try either generic diovan or avapro if ARB needed or use an alternative class altogether.  See:  Dewayne HatchAnn Allergy Asthma Immunol  2008: 101: p [phone]   Total time devoted to counseling  = 35/8054m review case with pt/mother  discussion of options/alternatives/ personally creating in presence of pt  then going over specific  Instructions directly with the pt including how to use all of the meds but in particular covering each new medication in detail (see avs)

## 2015-11-23 NOTE — Progress Notes (Signed)
Subjective:    Patient ID: Patricia Keith, female    DOB: [birth year],   MRN: [id number]  HPI  89 yowf never smoker born at term with croup starting around age of 5 lasting from fall to spring better in warm weather assoc with a lot of ear infections and seemed to resolve Junior high but persitent daily symptoms of cough > sob  since 2014 and abn CT chest so referred to pulmonary clinic 11/23/2015 by Dr [name]    11/23/2015 1st [facility] office visit/ Patricia Keith   Chief Complaint  Patient presents with  . Pulmonary Consult    Referred by Dr. [name] for eval of pulmonary nodule. She c/o cough and SOB "forever"- dxed with COPD in 2014.   allergy symptoms assoc with rhinitis so had allergy testing [employer] 9th grade/ told asthma dogs/cats/trees never shots better with"allergy pill" / no inhalers and got rid of dog and fine again until her 40's with variable doe which worsened over time and placed on Ventolin since Oct 2014 avg once daily since then and has to sleep on 3 pillows or smothering so started symbicort 160 Dec 2016 helped some with less need for albuterol but  Has not tried to lie flat since took symbicort 160 and does not feel back to baseline doe and still needing some albuterol daily "when over does it"   Cough is dry and day >> noct  No obvious other patterns in day to day or daytime variabilty or assoc chronic cough or cp or chest tightness, subjective wheeze overt sinus or hb symptoms. No unusual exp hx or h/o childhood pna/ asthma or knowledge of premature birth.  Sleeping ok without nocturnal  or early am exacerbation  of respiratory  c/o's or need for noct saba. Also denies any obvious fluctuation of symptoms with weather or environmental changes or other aggravating or alleviating factors except as outlined above   Current Medications, Allergies, Complete Past Medical History, Past Surgical History, Family History, and Social History were reviewed in [facility]  electronic medical record.               Review of Systems  Constitutional: Positive for appetite change. Negative for fever, chills and unexpected weight change.  HENT: Positive for congestion, ear pain and sore throat. Negative for dental problem, nosebleeds, postnasal drip, rhinorrhea, sinus pressure, sneezing, trouble swallowing and voice change.   Eyes: Negative for visual disturbance.  Respiratory: Positive for cough and shortness of breath. Negative for choking.   Cardiovascular: Positive for leg swelling. Negative for chest pain.  Gastrointestinal: Negative for vomiting, abdominal pain and diarrhea.  Genitourinary: Negative for difficulty urinating.  Musculoskeletal: Negative for arthralgias.  Skin: Negative for rash.  Neurological: Positive for headaches. Negative for tremors and syncope.  Hematological: Does not bruise/bleed easily.       Objective:   Physical Exam  Anxious but quite pleasant amb wf   Wt Readings from Last 3 Encounters:  11/23/15 175 lb 6.4 oz (79.561 kg)  10/22/15 173 lb 12.8 oz (78.835 kg)  08/31/15 168 lb (76.204 kg)    Vital signs reviewed   HEENT: nl dentition, turbinates, and oropharynx. Nl external ear canals without cough reflex   NECK :  without JVD/Nodes/TM/ nl carotid upstrokes bilaterally   LUNGS: no acc muscle use,  Nl contour chest which is clear to A and P bilaterally without cough on insp or exp maneuvers   CV:  RRR  no s3 or murmur  or increase in P2, no edema   ABD:  soft and nontender with nl inspiratory excursion in the supine position. No bruits or organomegaly, bowel sounds nl  MS:  Nl gait/ ext warm without deformities, calf tenderness, cyanosis or clubbing No obvious joint restrictions   SKIN: warm and dry without lesions    NEURO:  alert, approp, nl sensorium with  no motor deficits     CT chest 10/16/15  < 4 mm MPNs      Assessment & Plan:

## 2015-11-23 NOTE — Patient Instructions (Addendum)
Plan A = Automatic = change to  Symbicort 80 Take 2 puffs first thing in am and then another 2 puffs about 12 hours later.   Work on inhaler technique:  relax and gently blow all the way out then take a nice smooth deep breath back in, triggering the inhaler at same time you start breathing in.  Hold for up to 5 seconds if you can. Blow out thru nose. Rinse and gargle with water when done      Plan B = Backup Only use your albuterol as a rescue medication to be used if you can't catch your breath by resting or doing a relaxed purse lip breathing pattern.  - The less you use it, the better it will work when you need it. - Ok to use the inhaler up to 2 puffs  every 4 hours if you must but call for appointment if use goes up over your usual need - Don't leave home without it !!  (think of it like the spare tire for your car)     Pantoprazole (protonix) 40 mg   Take  30-60 min before first meal of the day and Pepcid (famotidine)  20 mg one @  bedtime until return to office - this is the best way to tell whether stomach acid is contributing to your problem.    GERD (REFLUX)  is an extremely common cause of respiratory symptoms just like yours , many times with no obvious heartburn at all.    It can be treated with medication, but also with lifestyle changes including elevation of the head of your bed (ideally with 6 inch  bed blocks),  Smoking cessation, avoidance of late meals, excessive alcohol, and avoid fatty foods, chocolate, peppermint, colas, red wine, and acidic juices such as orange juice.  NO MINT OR MENTHOL PRODUCTS SO NO COUGH DROPS  USE SUGARLESS CANDY INSTEAD (Jolley ranchers or Stover's or Life Savers) or even ice chips will also do - the key is to swallow to prevent all throat clearing. NO OIL BASED VITAMINS - use powdered substitutes.    Please schedule a follow up office visit in 6 weeks, call sooner if needed ok to push back with PFTs same day (or [facility]  [facility])

## 2015-11-25 DIAGNOSIS — Z6828 Body mass index (BMI) 28.0-28.9, adult: Secondary | ICD-10-CM | POA: Diagnosis not present

## 2015-11-25 DIAGNOSIS — W57XXXA Bitten or stung by nonvenomous insect and other nonvenomous arthropods, initial encounter: Secondary | ICD-10-CM | POA: Diagnosis not present

## 2015-11-25 DIAGNOSIS — L538 Other specified erythematous conditions: Secondary | ICD-10-CM | POA: Diagnosis not present

## 2015-11-25 DIAGNOSIS — Z1389 Encounter for screening for other disorder: Secondary | ICD-10-CM | POA: Diagnosis not present

## 2015-11-25 DIAGNOSIS — E663 Overweight: Secondary | ICD-10-CM | POA: Diagnosis not present

## 2015-12-08 ENCOUNTER — Encounter: Payer: Self-pay | Admitting: Gastroenterology

## 2015-12-10 DIAGNOSIS — R600 Localized edema: Secondary | ICD-10-CM | POA: Diagnosis not present

## 2015-12-10 DIAGNOSIS — I10 Essential (primary) hypertension: Secondary | ICD-10-CM | POA: Diagnosis not present

## 2015-12-10 DIAGNOSIS — E663 Overweight: Secondary | ICD-10-CM | POA: Diagnosis not present

## 2015-12-10 DIAGNOSIS — Z1389 Encounter for screening for other disorder: Secondary | ICD-10-CM | POA: Diagnosis not present

## 2015-12-10 DIAGNOSIS — Z6828 Body mass index (BMI) 28.0-28.9, adult: Secondary | ICD-10-CM | POA: Diagnosis not present

## 2016-01-12 DIAGNOSIS — R600 Localized edema: Secondary | ICD-10-CM | POA: Diagnosis not present

## 2016-01-12 DIAGNOSIS — Z1389 Encounter for screening for other disorder: Secondary | ICD-10-CM | POA: Diagnosis not present

## 2016-01-12 DIAGNOSIS — I10 Essential (primary) hypertension: Secondary | ICD-10-CM | POA: Diagnosis not present

## 2016-01-12 DIAGNOSIS — Z6829 Body mass index (BMI) 29.0-29.9, adult: Secondary | ICD-10-CM | POA: Diagnosis not present

## 2016-01-12 DIAGNOSIS — E663 Overweight: Secondary | ICD-10-CM | POA: Diagnosis not present

## 2016-01-20 ENCOUNTER — Other Ambulatory Visit: Payer: Self-pay

## 2016-01-20 ENCOUNTER — Encounter: Payer: Self-pay | Admitting: Gastroenterology

## 2016-01-20 ENCOUNTER — Ambulatory Visit (INDEPENDENT_AMBULATORY_CARE_PROVIDER_SITE_OTHER): Payer: BLUE CROSS/BLUE SHIELD | Admitting: Gastroenterology

## 2016-01-20 VITALS — BP 174/89 | HR 61 | Temp 97.3°F | Ht 66.0 in | Wt 174.8 lb

## 2016-01-20 DIAGNOSIS — R198 Other specified symptoms and signs involving the digestive system and abdomen: Secondary | ICD-10-CM | POA: Diagnosis not present

## 2016-01-20 DIAGNOSIS — R194 Change in bowel habit: Secondary | ICD-10-CM | POA: Diagnosis not present

## 2016-01-20 DIAGNOSIS — R197 Diarrhea, unspecified: Secondary | ICD-10-CM

## 2016-01-20 DIAGNOSIS — R140 Abdominal distension (gaseous): Secondary | ICD-10-CM

## 2016-01-20 LAB — CBC WITH DIFFERENTIAL/PLATELET
BASOS ABS: 0 {cells}/uL (ref 0–200)
Basophils Relative: 0 %
EOS ABS: 165 {cells}/uL (ref 15–500)
Eosinophils Relative: 3 %
HEMATOCRIT: 40.1 % (ref 35.0–45.0)
HEMOGLOBIN: 13 g/dL (ref 11.7–15.5)
LYMPHS ABS: 1870 {cells}/uL (ref 850–3900)
Lymphocytes Relative: 34 %
MCH: 26.3 pg — AB (ref 27.0–33.0)
MCHC: 32.4 g/dL (ref 32.0–36.0)
MCV: 81 fL (ref 80.0–100.0)
MONO ABS: 605 {cells}/uL (ref 200–950)
MPV: 9.5 fL (ref 7.5–12.5)
Monocytes Relative: 11 %
NEUTROS PCT: 52 %
Neutro Abs: 2860 cells/uL (ref 1500–7800)
Platelets: 278 10*3/uL (ref 140–400)
RBC: 4.95 MIL/uL (ref 3.80–5.10)
RDW: 14.2 % (ref 11.0–15.0)
WBC: 5.5 10*3/uL (ref 3.8–10.8)

## 2016-01-20 LAB — TSH: TSH: 1.11 m[IU]/L

## 2016-01-20 NOTE — Patient Instructions (Addendum)
KAOPECTATE AS NEEDED FOR LOOSE STOOLS UNTIL STOOL RESULTS KNOWN.  COMPLETE STOOL STUDIES AND LABS.  AVOID DAIRY SEE HANDOUT.  COMPLETE HYDROGEN BREATH TEST TO EVALUATE FOR THE WRONG BACTERIA MIX IN YOUR SMALL BOWEL(SMALL INTESTINE BACTERIAL OVERGROWTH).  AFTER THE BREATH TEST, TAKE A PROBIOTIC DAILY FOR FOUR MOS ([facility] BRAND, PHILLIP'S COLON HEALTH, ALIGN). STOP IT IF YOU FEEL IT'S NOT MAKING A DIFFERENCE.   FOLLOW UP IN 3 MOS.    Lactose Free Diet Lactose is a carbohydrate that is found mainly in milk and milk products, as well as in foods with added milk or whey. Lactose must be digested by the enzyme in order to be used by the body. Lactose intolerance occurs when there is a shortage of lactase. When your body is not able to digest lactose, you may feel sick to your stomach (nausea), bloating, cramping, gas and diarrhea.  There are many dairy products that may be tolerated better than milk by some people:  The use of cultured dairy products such as yogurt, buttermilk, cottage cheese, and sweet acidophilus milk (Kefir) for lactase-deficient individuals is usually well tolerated. This is because the healthy bacteria help digest lactose.   Lactose-hydrolyzed milk (Lactaid) contains 40-90% less lactose than milk and may also be well tolerated.   SPECIAL NOTES  Lactose is a carbohydrates. The major food source is dairy products. Reading food labels is important. Many products contain lactose even when they are not made from milk. Look for the following words: whey, milk solids, dry milk solids, nonfat dry milk powder. Typical sources of lactose other than dairy products include breads, candies, cold cuts, prepared and processed foods, and commercial sauces and gravies.   All foods must be prepared without milk, cream, or other dairy foods.   Soy milk and lactose-free supplements (LACTASE) may be used as an alternative to milk.   FOOD GROUP ALLOWED/RECOMMENDED AVOID/USE SPARINGLY   BREADS / STARCHES 4 servings or more* Breads and rolls made without milk. [place], [place], or [place] bread. Breads and rolls that contain milk. Prepared mixes such as muffins, biscuits, waffles, pancakes. Sweet rolls, donuts, [place] toast (if made with milk or lactose).  Crackers: Soda crackers, graham crackers. Any crackers prepared without lactose. Zwieback crackers, corn curls, or any that contain lactose.  Cereals: Cooked or dry cereals prepared without lactose (read labels). Cooked or dry cereals prepared with lactose (read labels). Total, Cocoa Krispies. Special K.  Potatoes / Pasta / Rice: Any prepared without milk or lactose. Popcorn. Instant potatoes, frozen [place] fries, scalloped or au gratin potatoes.  VEGETABLES 2 servings or more Fresh, frozen, and canned vegetables. Creamed or breaded vegetables. Vegetables in a cheese sauce or with lactose-containing margarines.  FRUIT 2 servings or more All fresh, canned, or frozen fruits that are not processed with lactose. Any canned or frozen fruits processed with lactose.  MEAT & SUBSTITUTES 2 servings or more (4 to 6 oz. total per day) Plain beef, chicken, fish, [place], lamb, veal, pork, or ham. Kosher prepared meat products. Strained or junior meats that do not contain milk. Eggs, soy meat substitutes, nuts. Scrambled eggs, omelets, and souffles that contain milk. Creamed or breaded meat, fish, or fowl. Sausage products such as wieners, liver sausage, or cold cuts that contain milk solids. Cheese, cottage cheese, or cheese spreads.  MILK None. (See "BEVERAGES" for milk substitutes. See "DESSERTS" for ice cream and frozen desserts.) Milk (whole, 2%, skim, or chocolate). Evaporated, powdered, or condensed milk; malted milk.  SOUPS & COMBINATION FOODS  Bouillon, broth, vegetable soups, clear soups, consomms. Homemade soups made with allowed ingredients. Combination or prepared foods that do not contain milk or milk products (read labels). Cream  soups, chowders, commercially prepared soups containing lactose. Macaroni and cheese, pizza. Combination or prepared foods that contain milk or milk products.  DESSERTS & SWEETS In moderation Water and fruit ices; gelatin; angel food cake. Homemade cookies, pies, or cakes made from allowed ingredients. Pudding (if made with water or a milk substitute). Lactose-free tofu desserts. Sugar, honey, corn syrup, jam, jelly; marmalade; molasses (beet sugar); Pure sugar candy; marshmallows. Ice cream, ice milk, sherbet, custard, pudding, frozen yogurt. Commercial cake and cookie mixes. Desserts that contain chocolate. Pie crust made with milk-containing margarine; reduced-calorie desserts made with a sugar substitute that contains lactose. Toffee, peppermint, butterscotch, chocolate, caramels.  FATS & OILS In moderation Butter (as tolerated; contains very small amounts of lactose). Margarines and dressings that do not contain milk, Vegetable oils, shortening, Miracle Whip, mayonnaise, nondairy cream & whipped toppings without lactose or milk solids added (examples: Coffee Rich, Carnation Coffeemate, Rich's Whipped Topping, PolyRich). Tomasa BlaseBacon. Margarines and salad dressings containing milk; cream, cream cheese; peanut butter with added milk solids, sour cream, chip dips, made with sour cream.  BEVERAGES Carbonated drinks; tea; coffee and freeze-dried coffee; some instant coffees (check labels). Fruit drinks; fruit and vegetable juice; Rice or Soy milk. Ovaltine, hot chocolate. Some cocoas; some instant coffees; instant iced teas; powdered fruit drinks (read labels).   CONDIMENTS / MISCELLANEOUS Soy sauce, carob powder, olives, gravy made with water, baker's cocoa, pickles, pure seasonings and spices, wine, pure monosodium glutamate, catsup, mustard. Some chewing gums, chocolate, some cocoas. Certain antibiotics and vitamin / mineral preparations. Spice blends if they contain milk products. MSG extender. Artificial  sweeteners that contain lactose such as Equal (Nutra-Sweet) and Sweet 'n Low. Some nondairy creamers (read labels).   SAMPLE MENU*  Breakfast   Orange Juice.  Banana.   Bran flakes.   Nondairy Creamer.  [place]ienna Bread (toasted).   Butter or milk-free margarine.   Coffee or tea.    Noon Meal   Chicken Breast.  Rice.   Green beans.   Butter or milk-free margarine.  Fresh melon.   Coffee or tea.    Evening Meal   Roast Beef.  Baked potato.   Butter or milk-free margarine.   Broccoli.   Lettuce salad with vinegar and oil dressing.  [employer]el food cake.   Coffee or tea.

## 2016-01-20 NOTE — Progress Notes (Signed)
ON RECALL  °

## 2016-01-20 NOTE — Assessment & Plan Note (Addendum)
SYMPTOMS NOT IDEALLY CONTROLLED. DIFFERENTIAL DIAGNOSIS INCLUDES: IBS-D,  SMALL INTESTINE BACTERIAL OVERGROWTH,.EOSINOPHILIC GE,  LESS LIKELY CELIAC SPRUE, THYROID DISTURBANCE, GIARDIASIS, OR C DIFF COLITIS.  KAOPECTATE AS NEEDED FOR LOOSE STOOLS UNTIL STOOL RESULTS KNOWN. AFTER RESULTED ADD BENTYL 10 MG QAC. COMPLETE STOOL STUDIES AND LABS. AVOID DAIRY. HANDOUT GIVEN. COMPLETE HYDROGEN BREATH TEST TO EVALUATE FOR THE WRONG BACTERIA MIX IN YOUR SMALL BOWEL(SMALL INTESTINE BACTERIAL OVERGROWTH). AFTER THE BREATH TEST, TAKE A PROBIOTIC DAILY FOR FOUR MOS ([facility] BRAND, PHILLIP'S COLON HEALTH, ALIGN). STOP IT IF YOU FEEL IT'S NOT MAKING A DIFFERENCE.  FOLLOW UP IN 3 MOS.

## 2016-01-20 NOTE — Progress Notes (Signed)
CC'ED TO PCP 

## 2016-01-20 NOTE — Progress Notes (Signed)
Subjective:    Patient ID: Patricia Keith, female    DOB: 04/13/1961, 55 y.o.   MRN: [id number]  Colette RibasGOLDING, JOHN CABOT, MD   HPI CH[facility] SWELL[facility] IN HER FEET. BEEN ON ATENOLOL FOR 2 WEEKS. STARTED HCTZ WITHIN THE LAST WEEK. WAKES UP AT NIGHT FOR BM 1-2X/MO. HAVING TROUBLE WITH ANOREXIA, BLOATING, AND AFTER MEALS. BMs: 4-5 X/DAY. STARTS OUT LOOSE THEN WATERY AND WITH MUCOUSY. NOT BLACK, RED , OR WHITE STOOL. RARE ABDOMINAL PAIN IN THE LOWER ABDOMEN. RARE NAUSEA/NAUSEA/SORES IN MOUTH OR LEGS. ON DOXYCYCLINE FOR TICK BITE: 2 WEEKS. STOOLS CHANGED WHEN STARTED DOXYCYLINE. SYMPTOMS STARTED AFTER DOXY AND TOOK IT FOR 10 DAYS. LAST DOSE IN may but stools not back to normal. No other new meds oR OTC MEDS. No travel. Well water: no. WEIGHT: SAME. APPETITE: DOWN. 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 NOT FEELING STRESSED. LAST TSH: ????. NO HEMATURIA AND RARE DYSURIA. Last TCS JAN 2017: nl colon bX  PT DENIES FEVER, CHILLS, HEMATOCHEZIA, HEMATEMESIS, vomiting, melena, CHEST PAIN, WORSENING OF SHORTNESS OF BREATH,  constipation, OR heartburn or indigestion.   Past Medical History  Diagnosis Date  . Hypertension   . Migraines   . COPD (chronic obstructive pulmonary disease) (HCC)     DX 2015  . Meniere disease 2015   Past Surgical History  Procedure Laterality Date  . Appendectomy    . Tonsillectomy    . Colonoscopy N/A 08/31/2015    SLF: 1. one rectal polyp removed  2. mild diverticulosis in the sigmoid colon 3. small internal hemorrhoids     No Known Allergies  Current Outpatient Prescriptions  Medication Sig Dispense Refill  . albuterol (PROVENTIL HFA;VENTOLIN HFA) 108 (90 BASE) MCG/ACT inhaler Inhale into the lungs every 6 (six) hours as needed for wheezing or shortness of breath.    Marland Kitchen. aspirin 81 MG tablet Take 81 mg by mouth daily.    Marland Kitchen. atenolol (TENORMIN) 100 MG tablet     . b complex vitamins tablet Take 1 tablet by  mouth daily.    . budesonide-formoterol (SYMBICORT) 80-4.5 MCG/ACT inhaler Take 2 puffs first thing in am and then another 2 puffs about 12 hours later.    . Cholecalciferol (VITAMIN D PO) Take 1 capsule by mouth daily.    . diazepam (VALIUM) 2 MG tablet Take 2 mg by mouth every 6 (six) hours as needed for anxiety.    Marland Kitchen. escitalopram (LEXAPRO) 20 MG tablet Take 20 mg by mouth daily.    . famotidine (PEPCID) 20 MG tablet One at bedtime    . Ferrous Sulfate (IRON SUPPLEMENT PO) Take 1 tablet by mouth daily.    . fluticasone (FLONASE) 50 MCG/ACT nasal spray Place into both nostrils daily.    . furosemide (LASIX) 20 MG tablet Take 20 mg by mouth daily.    . hydrochlorothiazide (HYDRODIURIL) 25 MG tablet Take 25 mg by mouth daily.    Marland Kitchen. LORazepam (ATIVAN) 0.5 MG tablet Take 0.5 mg by mouth 3 (three) times daily as needed. For anxiety   Usually takes once daily    . losartan (COZAAR) 100 MG tablet Take 100 mg by mouth daily.    . meclizine (ANTIVERT) 25 MG tablet Take one every 6 hours for dizziness    . pantoprazole (PROTONIX) 40 MG tablet Take 1 tablet (40 mg total) by mouth daily.    . TRAZODONE HCL PO Take 1 tablet by mouth at bedtime  as needed.    .       Review of Systems PER HPI OTHERWISE ALL SYSTEMS ARE NEGATIVE.    Objective:   Physical Exam  Constitutional: She is oriented to person, place, and time. She appears well-developed and well-nourished. No distress.  HENT:  Head: Normocephalic and atraumatic.  Mouth/Throat: Oropharynx is clear and moist. No oropharyngeal exudate.  Eyes: Pupils are equal, round, and reactive to light. No scleral icterus.  Neck: Normal range of motion. Neck supple.  Cardiovascular: Normal rate, regular rhythm and normal heart sounds.   Pulmonary/Chest: Effort normal and breath sounds normal. No respiratory distress.  Abdominal: Soft. Bowel sounds are normal. She exhibits no distension. There is no tenderness.  Musculoskeletal: She exhibits no edema.    Lymphadenopathy:    She has no cervical adenopathy.  Neurological: She is alert and oriented to person, place, and time.  NO FOCAL DEFICITS  Psychiatric:  FLAT AFFECT, NL MOOD  Vitals reviewed.     Assessment & Plan:

## 2016-01-21 LAB — TISSUE TRANSGLUTAMINASE, IGA: Tissue Transglutaminase Ab, IgA: 1 U/mL (ref <4)

## 2016-01-22 NOTE — Progress Notes (Addendum)
PLEASE CALL PT. Her lab show a normal thyroid test and she does not have celiac sprue. HER BLOOD COUNT IS NORMAL.

## 2016-01-25 NOTE — Progress Notes (Signed)
Pt is aware of results. 

## 2016-01-25 NOTE — Progress Notes (Signed)
LMOM to call.

## 2016-01-28 ENCOUNTER — Encounter (HOSPITAL_COMMUNITY): Payer: Self-pay | Admitting: *Deleted

## 2016-01-28 ENCOUNTER — Telehealth: Payer: Self-pay | Admitting: General Practice

## 2016-01-28 ENCOUNTER — Ambulatory Visit (HOSPITAL_COMMUNITY)
Admission: RE | Admit: 2016-01-28 | Discharge: 2016-01-28 | Disposition: A | Discharge status: Home / Self Care | Payer: BLUE CROSS/BLUE SHIELD | Source: Ambulatory Visit | Attending: Gastroenterology | Admitting: Gastroenterology

## 2016-01-28 ENCOUNTER — Encounter (HOSPITAL_COMMUNITY)
Admission: RE | Disposition: A | Discharge status: Home / Self Care | Payer: Self-pay | Source: Ambulatory Visit | Attending: Gastroenterology

## 2016-01-28 DIAGNOSIS — R140 Abdominal distension (gaseous): Secondary | ICD-10-CM | POA: Insufficient documentation

## 2016-01-28 DIAGNOSIS — R197 Diarrhea, unspecified: Secondary | ICD-10-CM | POA: Insufficient documentation

## 2016-01-28 HISTORY — PX: BACTERIAL OVERGROWTH TEST: SHX5739

## 2016-01-28 SURGERY — BREATH TEST, FOR INTESTINAL BACTERIAL OVERGROWTH

## 2016-01-28 MED ORDER — LACTULOSE 10 GM/15ML PO SOLN
25.0000 g | Freq: Once | ORAL | Status: AC
  Administered 2016-01-28: 25 g via ORAL

## 2016-01-28 MED ORDER — LACTULOSE 10 GM/15ML PO SOLN
ORAL | Status: AC
  Filled 2016-01-28: qty 60

## 2016-01-28 MED ORDER — METRONIDAZOLE 500 MG PO TABS: 500.0000 mg | ORAL_TABLET | Freq: Two times a day (BID) | ORAL | Status: DC

## 2016-01-28 MED ORDER — CIPROFLOXACIN HCL 500 MG PO TABS: ORAL_TABLET | ORAL | Status: DC

## 2016-01-28 NOTE — Telephone Encounter (Addendum)
SAW PT IN ENDO. POS HBT. RX FOR CIPRO/FLAGYL SENT.   PLEASE CALL PT. HER HYDROGEN BREATH TEST SHOWS SMALL BOWEL BACTERIAL OVERGROWTH. SHE SHOULD HAVE LESS DIARRHEA AFTER TAKING ANTIBIOTICS. CIPRO 500 MF & FLAGYL 500 MG BID for 5 days. CIPRO CAN CAUSE ACHILLES RUPTURE. STOP CIPRO AND CALL IF YOU DEVELOP PAIN IN YOUR HEEL. FLAGYL MAY CAUSE NAUSEA, VOMITING, OR LEG WEAKNESS.  DO NOT DRINK ALCOHOL OR TAKE COUGH SYRUP WHILE TAKING FLAGYL. CONTINUE BENTYL AND PROBIOTIC. HOLD BENTYL IF YOU HAVE CONSTIPATION. PLEASE CALL IN ONE MONTH IF SYMPTOMS ARE NOT IMPROVED. OPV IN SEP 2017 E30 DIARRHEA/CHANGE IN BOWEL HABITS.

## 2016-01-28 NOTE — Telephone Encounter (Signed)
[facility] stating that the patient is having her bacterial overgrowth test done today and it's postitve, however Dr. Darrick PennaFields will be sending in medication for treatment  Routing to Dr. Darrick PennaFields as a Lorain ChildesFYI

## 2016-01-28 NOTE — Telephone Encounter (Signed)
Reminder in epic °

## 2016-01-28 NOTE — Telephone Encounter (Signed)
LMOM for a return call.  

## 2016-01-28 NOTE — Addendum Note (Signed)
Addended by: West BaliFIELDS, Kiano Terrien L on: 01/28/2016 11:15 AM   Modules accepted: Orders

## 2016-01-28 NOTE — OR Nursing (Signed)
No beans, bran or high fiber cereal the day before the procedure?NO NPO except for water 12 hours before procedure? YES No smoking, sleeping or vigorous exercising for at least 30 before procedure?  NO Recent antibiotic use and/or diarrhea?  The reason for the test is diarrhea.   If yes, physician notified.  Dr. Darrick PennaFields notified of results. Dr. Darrick PennaFields spoke with patient and discussed the results. Dr. Darrick PennaFields [facility]. Patient informed and verbalized understanding.   Time Baseline 15 mins 30 mins 45 mins 60 mins 75 mins 90 mins 105 mins 120 mins 135 mins 150 mins 165 mins 180 mins  H2-ppm 2 2 6 8 11 27 31  44 [phone] 68

## 2016-01-28 NOTE — Telephone Encounter (Signed)
Pt is aware.  

## 2016-01-29 ENCOUNTER — Encounter: Payer: Self-pay | Admitting: Internal Medicine

## 2016-01-29 ENCOUNTER — Ambulatory Visit (INDEPENDENT_AMBULATORY_CARE_PROVIDER_SITE_OTHER): Payer: BLUE CROSS/BLUE SHIELD | Admitting: Internal Medicine

## 2016-01-29 VITALS — BP 130/90 | HR 72 | Ht 66.0 in | Wt 175.0 lb

## 2016-01-29 DIAGNOSIS — I10 Essential (primary) hypertension: Secondary | ICD-10-CM | POA: Diagnosis not present

## 2016-01-29 DIAGNOSIS — J45991 Cough variant asthma: Secondary | ICD-10-CM

## 2016-01-29 LAB — PULMONARY FUNCTION TEST
DL/VA % pred: 84 %
DL/VA: 4.23 ml/min/mmHg/L
DLCO UNC: 19.69 ml/min/mmHg
DLCO cor % pred: 74 %
DLCO cor: 20.14 ml/min/mmHg
DLCO unc % pred: 73 %
FEF 25-75 Post: 2.52 L/sec
FEF 25-75 Pre: 2.15 L/sec
FEF2575-%Change-Post: 17 %
FEF2575-%PRED-POST: 93 %
FEF2575-%Pred-Pre: 79 %
FEV1-%CHANGE-POST: 4 %
FEV1-%PRED-POST: 79 %
FEV1-%Pred-Pre: 76 %
FEV1-PRE: 2.22 L
FEV1-Post: 2.31 L
FEV1FVC-%Change-Post: 2 %
FEV1FVC-%PRED-PRE: 100 %
FEV6-%Change-Post: 1 %
FEV6-%PRED-POST: 79 %
FEV6-%Pred-Pre: 77 %
FEV6-Post: 2.84 L
FEV6-Pre: 2.79 L
FEV6FVC-%PRED-POST: 103 %
FEV6FVC-%Pred-Pre: 103 %
FVC-%CHANGE-POST: 1 %
FVC-%Pred-Post: 76 %
FVC-%Pred-Pre: 75 %
FVC-POST: 2.84 L
FVC-PRE: 2.79 L
POST FEV1/FVC RATIO: 81 %
POST FEV6/FVC RATIO: 100 %
PRE FEV6/FVC RATIO: 100 %
Pre FEV1/FVC ratio: 79 %
RV % PRED: 102 %
RV: 2.02 L
TLC % pred: 94 %
TLC: 5.05 L

## 2016-01-29 NOTE — Patient Instructions (Signed)
If ok with your GI doctor, ok with me to try off the acid suppression to see what if any difference it makes with respiratory   You can use pepcid 20 mg twice daily as needed for heart burn   If breathing /cough worse despite symbicort then need to consider a substitute for tenormin = bisoprolol.   If you are satisfied with your treatment plan,  let your doctor know and he/she can either refill your medications or you can return here when your prescription runs out.     If in any way you are not 100% satisfied,  please tell us.  If 100% better, tell your friends!  Pulmonary follow up is as needed

## 2016-01-29 NOTE — Progress Notes (Signed)
Subjective:    Patient ID: Patricia Keith, female    DOB: 12/03/1960,   MRN: [id number]    Brief patient profile:  [age] yowf never smoker born at term with croup starting around age of 5 lasting from fall to spring better in warm weather assoc with a lot of ear infections and seemed to resolve Junior high but persitent daily symptoms of cough > sob  since 2014 and abn CT chest so referred to pulmonary clinic 11/23/2015 by Dr [name]    11/23/2015 [facility] office visit/ [name]   Chief Complaint  Patient presents with  . Pulmonary Consult    Referred by Dr. [name] for eval of pulmonary nodule. She c/o cough and SOB "forever"- dxed with COPD in 2014.   allergy symptoms assoc with rhinitis so had allergy testing [employer] 9th grade/ told asthma dogs/cats/trees never shots better with"allergy pill" / no inhalers and got rid of dog and fine again until her 40's with variable doe which worsened over time and placed on Ventolin since Oct 2014 avg once daily since then and has to sleep on 3 pillows or smothering so started symbicort 160 Dec 2016 helped some with less need for albuterol but  Has not tried to lie flat since took symbicort 160 and does not feel back to baseline doe and still needing some albuterol daily "when over does it"  Cough is dry and day >> noct rec Plan A = Automatic = change to  Symbicort 80 Take 2 puffs first thing in am and then another 2 puffs about 12 hours later.  Work on inhaler technique:   Plan B = Backup Only use your albuterol as a rescue medication   Pantoprazole (protonix) 40 mg   Take  30-60 min before first meal of the day and Pepcid (famotidine)  20 mg one @  Bedtime    01/29/2016  f/u ov/[name] re: cough variant asthma  Chief Complaint  Patient presents with  . Follow-up    PFT results. cough has improved. no new concerns today.     Not limited by breathing from desired activities  Or need for saba  No obvious day to day or daytime  variability or assoc excess/ purulent sputum or mucus plugs or hemoptysis or cp or chest tightness, subjective wheeze or overt sinus or hb symptoms. No unusual exp hx or h/o childhood pna/ asthma or knowledge of premature birth.  Sleeping ok without nocturnal  or early am exacerbation  of respiratory  c/o's or need for noct saba. Also denies any obvious fluctuation of symptoms with weather or environmental changes or other aggravating or alleviating factors except as outlined above   Current Medications, Allergies, Complete Past Medical History, Past Surgical History, Family History, and Social History were [facility].  ROS  The following are not active complaints unless bolded sore throat, dysphagia, dental problems, itching, sneezing,  nasal congestion or excess/ purulent secretions, ear ache,   fever, chills, sweats, unintended wt loss, classically pleuritic or exertional cp,  orthopnea pnd or leg swelling, presyncope, palpitations, abdominal pain, anorexia, nausea, vomiting, diarrhea  or change in bowel or bladder habits, change in stools or urine, dysuria,hematuria,  rash, arthralgias, visual complaints, headache, numbness, weakness or ataxia or problems with walking or coordination,  change in mood/affect or memory.                       Objective:  Physical Exam    pleasant amb wf    01/29/2016      175  11/23/15 175 lb 6.4 oz (79.561 kg)  10/22/15 173 lb 12.8 oz (78.835 kg)  08/31/15 168 lb (76.204 kg)    Vital signs reviewed   HEENT: nl dentition, turbinates, and oropharynx. Nl external ear canals without cough reflex   NECK :  without JVD/Nodes/TM/ nl carotid upstrokes bilaterally   LUNGS: no acc muscle use,  Nl contour chest which is clear to A and P bilaterally without cough on insp or exp maneuvers   CV:  RRR  no s3 or murmur or increase in P2, no edema   ABD:  soft and nontender with nl inspiratory excursion in the  supine position. No bruits or organomegaly, bowel sounds nl  MS:  Nl gait/ ext warm without deformities, calf tenderness, cyanosis or clubbing No obvious joint restrictions   SKIN: warm and dry without lesions    NEURO:  alert, approp, nl sensorium with  no motor deficits           Assessment & Plan:

## 2016-01-29 NOTE — Assessment & Plan Note (Signed)
Adequate control on present rx, reviewed > no change in rx needed  But if cough recurs on therapeutic doses of symbicort esp if gerd already addressed Strongly prefer in this setting: Bystolic, the most beta -1  selective Beta blocker available in sample form, with bisoprolol the most selective generic choice  on the market.

## 2016-01-29 NOTE — Progress Notes (Signed)
PFT done today. 

## 2016-01-29 NOTE — Assessment & Plan Note (Addendum)
11/23/2015  After extensive coaching HFA effectiveness =    90%  > symbicort 80 2bid  - PFT's  01/29/2016  wnl before am symbicort     All goals of chronic asthma control met including optimal function and elimination of symptoms with minimal need for rescue therapy on low doses of symbicort (higher doses may aggravate uacs and that's why in this case the lower dose worked or it's because the uacs was a component and gerd needs rx) so ok to wean off gerd rx first and see if cough flares  If cough recurs on symb 80 and gerd rx next step is trial off tenormin (see hbp)  Contingencies discussed in full including contacting this office immediately if not controlling the symptoms using the rule of two's.     I had an extended discussion with the patient reviewing all relevant studies completed to date and  lasting 15 to 20 minutes of a 25 minute visit    Each maintenance medication was reviewed in detail including most importantly the difference between maintenance and prns and under what circumstances the prns are to be triggered using an action plan format that is not reflected in the computer generated alphabetically organized AVS.    Please see instructions for details which were reviewed in writing and the patient given a copy highlighting the part that I personally wrote and discussed at today's ov.

## 2016-02-01 ENCOUNTER — Encounter (HOSPITAL_COMMUNITY): Payer: Self-pay | Admitting: Gastroenterology

## 2016-02-05 NOTE — Telephone Encounter (Signed)
REVIEWED-NO ADDITIONAL RECOMMENDATIONS. 

## 2016-03-01 ENCOUNTER — Encounter: Payer: Self-pay | Admitting: Gastroenterology

## 2016-03-02 ENCOUNTER — Other Ambulatory Visit: Payer: Self-pay | Admitting: Internal Medicine

## 2016-03-10 ENCOUNTER — Ambulatory Visit (INDEPENDENT_AMBULATORY_CARE_PROVIDER_SITE_OTHER): Payer: BLUE CROSS/BLUE SHIELD | Admitting: Gastroenterology

## 2016-03-10 ENCOUNTER — Encounter: Payer: Self-pay | Admitting: Gastroenterology

## 2016-03-10 DIAGNOSIS — R194 Change in bowel habit: Secondary | ICD-10-CM

## 2016-03-10 MED ORDER — AMOXICILLIN-POT CLAVULANATE 500-125 MG PO TABS: ORAL_TABLET | ORAL | 0 refills | Status: DC

## 2016-03-10 MED ORDER — ELUXADOLINE 75 MG PO TABS: 75.0000 mg | ORAL_TABLET | Freq: Two times a day (BID) | ORAL | 5 refills | Status: DC

## 2016-03-10 NOTE — Patient Instructions (Signed)
STOP PROBIOTIC.  TAKE AUGMENTIN BID FOR 5 DAYS.  IT MAY CAUSE ABDOMINAL PAIN, NAUSEA, VOMITING, RASH, OR EXPLOSIVE DIARRHEA.  IF THE LOOSE STOOLS CONTINUES THEN START VIBERZI ON DAY 6, TRY VIBERZI. TAKE ONE TABLET DAILY FOR 7 DAYS THEN INCREASE TO TWICE DAILY IF NEEDED. HOLD IT IF YOU DON'T HAVE A BM IN A 24 HR PERIOD OF TIME. USE ONLY IMODIUM 1 OR 2 TIMES A DAY IF NEEDED TO CONTROL LOOSE STOOLS.  YOU MUST CALL IF YOU GO MORE THAN 4 DAYS WITHOUT A BOWEL MOVEMENT OR YOU DEVELOP INCREASE IN NAUSEA, VOMITING, OR ABDOMINAL PAIN.   PLEASE CALL IN ONE MONTH IF YOUR SYMPTOMS ARE NOT IMPROVED.   FOLLOW UP IN 3 MOS.

## 2016-03-10 NOTE — Assessment & Plan Note (Signed)
SYMPTOMS NOT CONTROLLED AND MAY BE DUE TO OVERLYING IBS AS WELL.  STOP PROBIOTIC. TAKE AUGMENTIN BID FOR 5 DAYS.  IT MAY CAUSE ABDOMINAL PAIN, NAUSEA, VOMITING, RASH, OR EXPLOSIVE DIARRHEA. IF THE LOOSE STOOLS CONTINUES THEN START VIBERZI ON DAY 6, TRY VIBERZI. TAKE ONE TABLET DAILY FOR 7 DAYS THEN INCREASE TO TWICE DAILY IF NEEDED. HOLD IT IF YOU DON'T HAVE A BM IN A 24 HR PERIOD OF TIME. USE ONLY IMODIUM 1 OR 2 TIMES A DAY IF NEEDED TO CONTROL LOOSE STOOLS. CALL IF YOU GO MORE THAN 4 DAYS WITHOUT A BOWEL MOVEMENT OR YOU DEVELOP INCREASE IN NAUSEA, VOMITING, OR ABDOMINAL PAIN. CALL IN ONE MONTH IF YOUR SYMPTOMS ARE NOT IMPROVED.   FOLLOW UP IN 3 MOS.

## 2016-03-10 NOTE — Progress Notes (Signed)
cc'ed to pcp °

## 2016-03-10 NOTE — Progress Notes (Signed)
ON RECALL  °

## 2016-03-10 NOTE — Progress Notes (Signed)
Subjective:    Patient ID: Patricia Keith, female    DOB: Nov 29, 1960, 55 y.o.   MRN: [id number] Colette Ribas, MD  HPI Before abx had bm 3-4 times every time she ate. 3 meals a day ~12 times a day. Tried cutting out dairy-no difference. Probiotic for at leaSts 2 mos but no change really. After abx: CUT STOOL OUTPUT IN HALF.  STRESS AS USUAL-JOB. 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 OCCASIONAL NAUSEA. NO TRAVEL OR ADDITIONAL ABX. HAVING TO GET UP TO HAVE A BM. STILL HAS GB. HAS CRAMPING BEFOREHAND AND HAS RECTAL URGENCY AND THEN HAS RARE 34. COMBO #6-7 MOST OF THE TIME. OCCASIONAL  ABDOMINAL PAIN ABOVE NAV[facility](CENTER)  PT DENIES FEVER, CHILLS, HEMATOCHEZIA, vomiting, melena, diarrhea, CHEST PAIN, SHORTNESS OF BREATH,  CHANGE IN BOWEL IN HABITS, constipation, problems swallowing, OR heartburn or indigestion.   Past Medical History:  Diagnosis Date  . COPD (chronic obstructive pulmonary disease) (HCC)    DX 2015  . Hypertension   . Meniere disease 2015  . Migraines     Past Surgical History:  Procedure Laterality Date  . APPENDECTOMY    . BACTERIAL OVERGROWTH TEST N/A 01/28/2016   Procedure: BACTERIAL OVERGROWTH TEST;  Surgeon: West Bali, MD;  Location: [facility];  Service: Endoscopy;  Laterality: N/A;  0700  . COLONOSCOPY N/A 08/31/2015   SLF: 1. one rectal polyp removed  2. mild diverticulosis in the sigmoid colon 3. small internal hemorrhoids    . TONSILLECTOMY      No Known Allergies  Current Outpatient Prescriptions  Medication Sig Dispense Refill  . albuterol (PROVENTIL HFA;VENTOLIN HFA) 108 (90 BASE) MCG/ACT inhaler Inhale into the lungs every 6 (six) hours as needed for wheezing or shortness of breath.    Marland Kitchen aspirin 81 MG tablet Take 81 mg by mouth daily.    Marland Kitchen atenolol (TENORMIN) 100 MG tablet Take 100 mg by mouth daily.     Marland Kitchen b complex vitamins tablet Take 1 tablet by mouth daily.    . Cholecalciferol (VITAMIN D PO) Take 1  capsule by mouth daily.     LEXAPRO Take 20 mg by mouth daily.    . famotidine (PEPCID) 20 MG tablet QHS QOD    . Ferrous Sulfate  Take 1 tablet by mouth daily.     FLONASE Place into both nostrils daily.    . furosemide (LASIX) 20 MG tablet Take 20 mg by mouth daily.    . hydrochlorothiazide  Take 25 mg by mouth daily.    Marland Kitchen iADVIL,MOTRIN 200 MG tablet Take 400 mg Q6H PRN for moderate pain.    Marland Kitchen LORazepam (ATIVAN) 0.5 MG tablet Take 0.5 mg by mouth daily. For anxiety   Usually takes once daily    . losartan (COZAAR) 100 MG tablet Take 100 mg by mouth daily.    . meclizine (ANTIVERT) 25 MG tablet Take one every 6 hours for dizziness (     PROTONIX 40 TAKE 1 TABLETQD 30 TO 60 MIN BEFORE 1ST MEAL     . SYMBICORT 80-4.5 MCG/ACT inhaler INHALE 2 PUFFS BID    . traZODone (DESYREL) 50 MG tablet Take 50 mg by mouth at bedtime as needed for sleep.    .      . diazepam (VALIUM) 2 MG tablet Take 2 mg by mouth every 6 (six) hours as needed for anxiety.    .       Review of  Systems PER HPI OTHERWISE ALL SYSTEMS ARE NEGATIVE.    Objective:   Physical Exam  Constitutional: She is oriented to person, place, and time. She appears well-developed and well-nourished. No distress.  HENT:  Head: Normocephalic and atraumatic.  Mouth/Throat: Oropharynx is clear and moist. No oropharyngeal exudate.  Eyes: Pupils are equal, round, and reactive to light. No scleral icterus.  Neck: Normal range of motion. Neck supple.  Cardiovascular: Normal rate, regular rhythm and normal heart sounds.   Pulmonary/Chest: Effort normal and breath sounds normal. No respiratory distress.  Abdominal: Soft. Bowel sounds are normal. She exhibits no distension. There is no tenderness.  Musculoskeletal: She exhibits no edema.  Lymphadenopathy:    She has no cervical adenopathy.  Neurological: She is alert and oriented to person, place, and time.  NO FOCAL DEFICITS  Psychiatric: She has a normal mood and affect.  Vitals  reviewed.     Assessment & Plan:

## 2016-04-01 ENCOUNTER — Other Ambulatory Visit: Payer: Self-pay | Admitting: Internal Medicine

## 2016-04-22 ENCOUNTER — Encounter: Payer: Self-pay | Admitting: Gastroenterology

## 2016-04-22 DIAGNOSIS — Z1389 Encounter for screening for other disorder: Secondary | ICD-10-CM | POA: Diagnosis not present

## 2016-04-22 DIAGNOSIS — Z6828 Body mass index (BMI) 28.0-28.9, adult: Secondary | ICD-10-CM | POA: Diagnosis not present

## 2016-04-22 DIAGNOSIS — N342 Other urethritis: Secondary | ICD-10-CM | POA: Diagnosis not present

## 2016-04-22 DIAGNOSIS — R350 Frequency of micturition: Secondary | ICD-10-CM | POA: Diagnosis not present

## 2016-04-22 NOTE — Telephone Encounter (Signed)
Contact pt's insurance company and see if we can get VIBERZI covered.

## 2016-04-26 ENCOUNTER — Telehealth: Payer: Self-pay

## 2016-04-26 MED ORDER — DICYCLOMINE HCL 10 MG PO CAPS: ORAL_CAPSULE | ORAL | 11 refills | Status: DC

## 2016-04-26 NOTE — Telephone Encounter (Signed)
PLEASE CALL PT. LET HER KNOW SHE HAS TO TRY BENTYL AND FAIL BEFORE HER INSURANCE WILL APPROVE VIBERZI. RX SENT FOR BENTYL 1-2 PO 30 MINS BEFORE MEALS AND AT BEDTIME. IT MAY CAUSE DRY MOUTH/EYES, DROWSINESS, DIFFICULTY URINATING, OR BLURRY VISION. PLEASE CALL WITH QUESTIONS OR CONCERNS.

## 2016-04-26 NOTE — Telephone Encounter (Signed)
I tried to do a PA for viberzi and [employer] [place] said she has to try and fail dicyclomine before they will approve it.

## 2016-04-27 NOTE — Telephone Encounter (Signed)
Pt called and is aware.  

## 2016-04-27 NOTE — Telephone Encounter (Signed)
LMOM for a return call.  

## 2016-05-02 ENCOUNTER — Other Ambulatory Visit: Payer: Self-pay | Admitting: Internal Medicine

## 2016-05-09 DIAGNOSIS — R319 Hematuria, unspecified: Secondary | ICD-10-CM | POA: Diagnosis not present

## 2016-05-12 ENCOUNTER — Encounter: Payer: Self-pay | Admitting: Gastroenterology

## 2016-05-24 DIAGNOSIS — Z23 Encounter for immunization: Secondary | ICD-10-CM | POA: Diagnosis not present

## 2016-06-14 DIAGNOSIS — N342 Other urethritis: Secondary | ICD-10-CM | POA: Diagnosis not present

## 2016-06-14 DIAGNOSIS — Z6829 Body mass index (BMI) 29.0-29.9, adult: Secondary | ICD-10-CM | POA: Diagnosis not present

## 2016-06-14 DIAGNOSIS — I10 Essential (primary) hypertension: Secondary | ICD-10-CM | POA: Diagnosis not present

## 2016-06-14 DIAGNOSIS — Z1389 Encounter for screening for other disorder: Secondary | ICD-10-CM | POA: Diagnosis not present

## 2016-06-28 ENCOUNTER — Emergency Department (HOSPITAL_COMMUNITY)
Admission: EM | Admit: 2016-06-28 | Discharge: 2016-06-28 | Disposition: A | Discharge status: Home / Self Care | Payer: BLUE CROSS/BLUE SHIELD | Attending: Emergency Medicine | Admitting: Emergency Medicine

## 2016-06-28 ENCOUNTER — Emergency Department (HOSPITAL_COMMUNITY): Payer: BLUE CROSS/BLUE SHIELD

## 2016-06-28 ENCOUNTER — Encounter (HOSPITAL_COMMUNITY): Payer: Self-pay | Admitting: Emergency Medicine

## 2016-06-28 DIAGNOSIS — I10 Essential (primary) hypertension: Secondary | ICD-10-CM

## 2016-06-28 DIAGNOSIS — R51 Headache: Secondary | ICD-10-CM | POA: Diagnosis not present

## 2016-06-28 DIAGNOSIS — J3489 Other specified disorders of nose and nasal sinuses: Secondary | ICD-10-CM | POA: Diagnosis not present

## 2016-06-28 DIAGNOSIS — Z6829 Body mass index (BMI) 29.0-29.9, adult: Secondary | ICD-10-CM | POA: Diagnosis not present

## 2016-06-28 DIAGNOSIS — R0789 Other chest pain: Secondary | ICD-10-CM | POA: Diagnosis not present

## 2016-06-28 DIAGNOSIS — J0130 Acute sphenoidal sinusitis, unspecified: Secondary | ICD-10-CM | POA: Diagnosis not present

## 2016-06-28 DIAGNOSIS — Z7982 Long term (current) use of aspirin: Secondary | ICD-10-CM | POA: Insufficient documentation

## 2016-06-28 DIAGNOSIS — Z1389 Encounter for screening for other disorder: Secondary | ICD-10-CM | POA: Diagnosis not present

## 2016-06-28 DIAGNOSIS — Z79899 Other long term (current) drug therapy: Secondary | ICD-10-CM | POA: Diagnosis not present

## 2016-06-28 DIAGNOSIS — R930 Abnormal findings on diagnostic imaging of skull and head, not elsewhere classified: Secondary | ICD-10-CM

## 2016-06-28 DIAGNOSIS — J449 Chronic obstructive pulmonary disease, unspecified: Secondary | ICD-10-CM | POA: Insufficient documentation

## 2016-06-28 DIAGNOSIS — R0600 Dyspnea, unspecified: Secondary | ICD-10-CM | POA: Diagnosis not present

## 2016-06-28 LAB — I-STAT CHEM 8, ED
BUN: 7 mg/dL (ref 6–20)
CREATININE: 0.7 mg/dL (ref 0.44–1.00)
Calcium, Ion: 1.11 mmol/L — ABNORMAL LOW (ref 1.15–1.40)
Chloride: 102 mmol/L (ref 101–111)
Glucose, Bld: 100 mg/dL — ABNORMAL HIGH (ref 65–99)
HEMATOCRIT: 40 % (ref 36.0–46.0)
HEMOGLOBIN: 13.6 g/dL (ref 12.0–15.0)
Potassium: 4.1 mmol/L (ref 3.5–5.1)
SODIUM: 141 mmol/L (ref 135–145)
TCO2: 27 mmol/L (ref 0–100)

## 2016-06-28 MED ORDER — CLONIDINE HCL 0.1 MG PO TABS
0.1000 mg | ORAL_TABLET | Freq: Once | ORAL | Status: AC
  Administered 2016-06-28: 0.1 mg via ORAL
  Filled 2016-06-28: qty 1

## 2016-06-28 MED ORDER — CLONIDINE HCL 0.2 MG PO TABS: 0.1000 mg | ORAL_TABLET | Freq: Two times a day (BID) | ORAL | 0 refills | Status: DC

## 2016-06-28 MED ORDER — AMOXICILLIN-POT CLAVULANATE 875-125 MG PO TABS: 1.0000 | ORAL_TABLET | Freq: Two times a day (BID) | ORAL | 0 refills | Status: DC

## 2016-06-28 NOTE — ED Notes (Signed)
Pt back in room from CT 

## 2016-06-28 NOTE — Discharge Instructions (Signed)
High blood pressure is now decreased to 174/84 after clonidine 0.1 mg.  Clonidine 0.1 mg will be added twice a day.  Please recheck your blood pressure with Dr. Phillips OdorGolding later this week. Head ct revealed opacification of the right sphenoid sinus possibly representing infection.  You are being started on augmentin.  Please follow up with ent as instructed.

## 2016-06-28 NOTE — ED Notes (Signed)
Initial attempt unsuccessful in right hand for I-stat chem 8. Successful attempt from right antecubital. Pt tolerated well.

## 2016-06-28 NOTE — ED Provider Notes (Signed)
[facility] Provider Note   CSN: [id number] Arrival date & time: 06/28/16  1426     History   Chief Complaint Chief Complaint  Patient presents with  . Hypertension    HPI Patricia Keith is a 55 y.o. female.  HPI   This is a 55 year old female with chronic hypertension who presents today due to worsening hypertension. She states that yesterday at 1:30 she had worsening of her headache. She has had chronic headaches which were thought to be tension-type in nature. Today yesterday it was worsened. She describes it as bilateral in nature. It has improved somewhat today. She took her blood pressure significantly elevated. She went to her primary care doctor's office and blood pressure is systolically greater than 200 J instructed to come here. She denies any vision changes, lateralized weakness, dyspnea, nausea, vomiting, or peripheral swelling. She has been taking her usual antihypertensive medications including [place] in her which was increased to twice a day several weeks ago. She denies missing any of her medications. She does not drink alcohol or smoke cigarettes.  Past Medical History:  Diagnosis Date  . COPD (chronic obstructive pulmonary disease) (HCC)    DX 2015  . Hypertension   . Meniere disease 2015  . Migraines     Patient Active Problem List   Diagnosis Date Noted  . Essential hypertension 01/29/2016  . Change in bowel habits 01/20/2016  . Cough variant asthma vs Upper airway cough syndrome 11/23/2015  . Multiple pulmonary nodules 11/23/2015  . Elevated alkaline phosphatase level 10/22/2015  . Abnormal ultrasound of abdomen 10/22/2015  . Special screening for malignant neoplasms, colon     Past Surgical History:  Procedure Laterality Date  . APPENDECTOMY    . BACTERIAL OVERGROWTH TEST N/A 01/28/2016   Procedure: BACTERIAL OVERGROWTH TEST;  Surgeon: West Bali, MD;  Location: [facility];  Service: Endoscopy;  Laterality: N/A;  0700  . COLONOSCOPY N/A  08/31/2015   SLF: 1. one rectal polyp removed  2. mild diverticulosis in the sigmoid colon 3. small internal hemorrhoids    . TONSILLECTOMY      OB History    Gravida Para Term Preterm AB Living   4 3 3   1      SAB TAB Ectopic Multiple Live Births   1               Home Medications    Prior to Admission medications   Medication Sig Start Date End Date Taking? Authorizing Provider  albuterol (PROVENTIL HFA;VENTOLIN HFA) 108 (90 BASE) MCG/ACT inhaler Inhale into the lungs every 6 (six) hours as needed for wheezing or shortness of breath.    Historical Provider, MD  amoxicillin-clavulanate (AUGMENTIN) 500-125 MG tablet 1 PO BID FOR 5 DAYS 03/10/16   West Bali, MD  aspirin 81 MG tablet Take 81 mg by mouth daily.    Historical Provider, MD  atenolol (TENORMIN) 100 MG tablet Take 100 mg by mouth daily.  01/18/16   Historical Provider, MD  b complex vitamins tablet Take 1 tablet by mouth daily.    Historical Provider, MD  Cholecalciferol (VITAMIN D PO) Take 1 capsule by mouth daily.    Historical Provider, MD  ciprofloxacin (CIPRO) 500 MG tablet 1 PO BID FOR 5 DAYS Patient not taking: Reported on 03/10/2016 01/28/16   West Bali, MD  diazepam (VALIUM) 2 MG tablet Take 2 mg by mouth every 6 (six) hours as needed for anxiety.    Historical Provider,  MD  dicyclomine (BENTYL) 10 MG capsule 1-2 po qac and hs. No more than 8 tablets per day. 04/26/16   West Bali, MD  Eluxadoline (VIBERZI) 75 MG TABS Take 75 mg by mouth 2 (two) times daily. 03/10/16   West Bali, MD  escitalopram (LEXAPRO) 20 MG tablet Take 20 mg by mouth daily. 02/08/14   Historical Provider, MD  famotidine (PEPCID) 20 MG tablet TAKE 1 TABLET BY MOUTH AT BEDTIME 04/01/16   Nyoka Cowden, MD  Ferrous Sulfate (IRON SUPPLEMENT PO) Take 1 tablet by mouth daily.    Historical Provider, MD  fluticasone (FLONASE) 50 MCG/ACT nasal spray Place into both nostrils daily.    Historical Provider, MD  furosemide (LASIX) 20 MG tablet  Take 20 mg by mouth daily. 02/08/14   Historical Provider, MD  hydrochlorothiazide (HYDRODIURIL) 25 MG tablet Take 25 mg by mouth daily. 01/12/16   Historical Provider, MD  ibuprofen (ADVIL,MOTRIN) 200 MG tablet Take 400 mg by mouth every 6 (six) hours as needed for moderate pain.    Historical Provider, MD  LORazepam (ATIVAN) 0.5 MG tablet Take 0.5 mg by mouth daily. For anxiety   Usually takes once daily 02/10/14   Historical Provider, MD  losartan (COZAAR) 100 MG tablet Take 100 mg by mouth daily. 01/13/16   Historical Provider, MD  meclizine (ANTIVERT) 25 MG tablet Take one every 6 hours for dizziness Patient taking differently: Take 25 mg by mouth every 6 (six) hours as needed for dizziness. Take one every 6 hours for dizziness 02/14/14   Bethann Berkshire, MD  metroNIDAZOLE (FLAGYL) 500 MG tablet Take 1 tablet (500 mg total) by mouth 2 (two) times daily. FOR 5 DAYS Patient not taking: Reported on 03/10/2016 01/28/16   West Bali, MD  pantoprazole (PROTONIX) 40 MG tablet TAKE 1 TABLET BY MOUTH EVERY DAY 30 TO 60 MINUTES BEFORE THE FIRST MEAL OF THE DAY 04/01/16   Nyoka Cowden, MD  SYMBICORT 80-4.5 MCG/ACT inhaler INHALE 2 PUFFS INTO THE LUNGS FIRST THING IN THE MORNING, AND THEN INHALE 2 PUFFS INTO THE LUNGS ABOUT 12 HOURS LATER 04/01/16   Nyoka Cowden, MD  traZODone (DESYREL) 50 MG tablet Take 50 mg by mouth at bedtime as needed for sleep.    Historical Provider, MD    Family History Family History  Problem Relation Age of Onset  . Allergies      "all of [place]"  . COPD Mother   . Breast cancer Mother   . Asthma Daughter   . Heart disease Father     Social History Social History  Substance Use Topics  . Smoking status: Never Smoker  . Smokeless tobacco: Never Used     Comment: Never smoked  . Alcohol use No     Allergies   Patient has no known allergies.   Review of Systems Review of Systems  Constitutional: Negative.   HENT: Negative.   Eyes: Negative.   Respiratory: Negative.    Cardiovascular: Negative.   Gastrointestinal: Negative.   Endocrine: Negative.   Genitourinary: Negative.   Musculoskeletal: Negative.   Allergic/Immunologic: Negative.   Neurological: Negative.   Hematological: Negative.   Psychiatric/Behavioral: Negative.   All other systems reviewed and are negative.    Physical Exam Updated Vital Signs BP 182/84   Pulse 64   Temp 97.6 F (36.4 C) (Oral)   Resp 14   Ht 5\' 7"  (1.702 m)   Wt 81.6 kg   SpO2 98%  BMI 28.19 kg/m   Physical Exam  Constitutional: She is oriented to person, place, and time. She appears well-developed and well-nourished. No distress.  HENT:  Head: Normocephalic and atraumatic.  Eyes: EOM are normal. Pupils are equal, round, and reactive to light.  Neck: Normal range of motion.  Cardiovascular: Normal rate, regular rhythm, normal heart sounds and intact distal pulses.   Pulmonary/Chest: Effort normal.  Abdominal: Soft. Bowel sounds are normal.  Musculoskeletal: Normal range of motion.  Neurological: She is alert and oriented to person, place, and time. She displays normal reflexes. No cranial nerve deficit or sensory deficit. She exhibits normal muscle tone. Coordination normal.  Skin: Skin is warm and dry. Capillary refill takes less than 2 seconds.  Psychiatric: She has a normal mood and affect. Her behavior is normal.  Nursing note and vitals reviewed.    ED Treatments / Results  Labs (all labs ordered are listed, but only abnormal results are displayed) Labs Reviewed  I-STAT CHEM 8, ED - Abnormal; Notable for the following:       Result Value   Glucose, Bld 100 (*)    Calcium, Ion 1.11 (*)    All other components within normal limits    EKG  EKG Interpretation None       Radiology Ct Head Wo Contrast  Result Date: 06/28/2016 CLINICAL DATA:  Headache and hypertension over the last 2 days. EXAM: CT HEAD WITHOUT CONTRAST TECHNIQUE: Contiguous axial images were obtained from the base of  the skull through the vertex without intravenous contrast. COMPARISON:  02/14/2014 FINDINGS: Brain: No evidence of malformation, atrophy, old or acute small or large vessel infarction, mass lesion, hemorrhage, hydrocephalus or extra-axial collection. No evidence of pituitary lesion. Vascular: No vascular calcification.  No hyperdense vessels. Skull: Normal.  No fracture or focal bone lesion. Sinuses/Orbits: Sinuses are clear with exception of opacification of the right division of the sphenoid sinus which is somewhat unusual in configuration. This could be associated with headache. Orbits appear normal. Other: None significant IMPRESSION: No intracranial abnormality. Opacification of a portion of the right division of the sphenoid sinus, which has a somewhat unusual configuration. This could be associated with headache. Electronically Signed   By: Paulina FusiMark  Shogry M.D.   On: 06/28/2016 16:06    Procedures Procedures (including critical care time)  Medications Ordered in ED Medications  cloNIDine (CATAPRES) tablet 0.1 mg (0.1 mg Oral Given 06/28/16 1610)     Initial Impression / Assessment and Plan / ED Course  I have reviewed the triage vital signs and the nursing notes.  Pertinent labs & imaging results that were available during my care of the patient were reviewed by me and considered in my medical decision making (see chart for details).  Clinical Course     1- hypertension- patient with bp decreasing here prior to intervention.  Clonidine 0.1 mg given and bp now Vitals:   06/28/16 1609 06/28/16 1709  BP: 182/84 174/84  Pulse:  66  Resp:  14  Temp:     Patient asymptomatic.  Plan continue clonidine 0.1 mg bid and recheck with Dr.Golding this week. 2- opacification of right sphenoid sinus- plan antibiotics and referral to ent.  This may represent infection and could be cause of headaches.  Given unusual configuration plan ent f/u.     Final Clinical Impressions(s) / ED Diagnoses    Final diagnoses:  Accelerated hypertension  Opacification of sphenoid sinus    New Prescriptions New Prescriptions   AMOXICILLIN-CLAVULANATE (AUGMENTIN) 875-125  MG TABLET    Take 1 tablet by mouth every 12 (twelve) hours.   CLONIDINE (CATAPRES) 0.2 MG TABLET    Take 0.5 tablets (0.1 mg total) by mouth 2 (two) times daily.     Margarita Grizzleanielle Rekha Hobbins, MD 06/28/16 [phone]

## 2016-06-28 NOTE — ED Triage Notes (Signed)
Pt states she has had elevated BP for the past 2 days. Pt sent by Dr's office.

## 2016-07-06 DIAGNOSIS — I10 Essential (primary) hypertension: Secondary | ICD-10-CM | POA: Diagnosis not present

## 2016-07-06 DIAGNOSIS — Z1389 Encounter for screening for other disorder: Secondary | ICD-10-CM | POA: Diagnosis not present

## 2016-07-06 DIAGNOSIS — Z6830 Body mass index (BMI) 30.0-30.9, adult: Secondary | ICD-10-CM | POA: Diagnosis not present

## 2016-07-07 ENCOUNTER — Other Ambulatory Visit (HOSPITAL_COMMUNITY): Payer: Self-pay | Admitting: Family Medicine

## 2016-07-10 DIAGNOSIS — Z1389 Encounter for screening for other disorder: Secondary | ICD-10-CM | POA: Diagnosis not present

## 2016-07-10 DIAGNOSIS — I10 Essential (primary) hypertension: Secondary | ICD-10-CM | POA: Diagnosis not present

## 2016-07-21 DIAGNOSIS — I10 Essential (primary) hypertension: Secondary | ICD-10-CM | POA: Diagnosis not present

## 2016-07-21 DIAGNOSIS — E748 Other specified disorders of carbohydrate metabolism: Secondary | ICD-10-CM | POA: Diagnosis not present

## 2016-07-21 DIAGNOSIS — Z1389 Encounter for screening for other disorder: Secondary | ICD-10-CM | POA: Diagnosis not present

## 2016-08-03 ENCOUNTER — Encounter: Payer: Self-pay | Admitting: Gastroenterology

## 2016-08-24 ENCOUNTER — Other Ambulatory Visit (HOSPITAL_COMMUNITY): Payer: Self-pay | Admitting: Family Medicine

## 2016-08-24 ENCOUNTER — Ambulatory Visit: Payer: BLUE CROSS/BLUE SHIELD | Admitting: Gastroenterology

## 2016-08-24 DIAGNOSIS — Z1231 Encounter for screening mammogram for malignant neoplasm of breast: Secondary | ICD-10-CM

## 2016-08-26 ENCOUNTER — Ambulatory Visit (HOSPITAL_COMMUNITY)
Admission: RE | Admit: 2016-08-26 | Discharge: 2016-08-26 | Disposition: A | Discharge status: Home / Self Care | Payer: BLUE CROSS/BLUE SHIELD | Source: Ambulatory Visit | Attending: Family Medicine | Admitting: Family Medicine

## 2016-08-26 DIAGNOSIS — Z1231 Encounter for screening mammogram for malignant neoplasm of breast: Secondary | ICD-10-CM | POA: Diagnosis not present

## 2016-09-01 ENCOUNTER — Encounter: Payer: Self-pay | Admitting: Gastroenterology

## 2016-09-01 ENCOUNTER — Ambulatory Visit (INDEPENDENT_AMBULATORY_CARE_PROVIDER_SITE_OTHER): Payer: BLUE CROSS/BLUE SHIELD | Admitting: Gastroenterology

## 2016-09-01 DIAGNOSIS — Z6830 Body mass index (BMI) 30.0-30.9, adult: Secondary | ICD-10-CM | POA: Diagnosis not present

## 2016-09-01 DIAGNOSIS — H8103 Meniere's disease, bilateral: Secondary | ICD-10-CM | POA: Diagnosis not present

## 2016-09-01 DIAGNOSIS — Z1389 Encounter for screening for other disorder: Secondary | ICD-10-CM | POA: Diagnosis not present

## 2016-09-01 DIAGNOSIS — D649 Anemia, unspecified: Secondary | ICD-10-CM | POA: Diagnosis not present

## 2016-09-01 DIAGNOSIS — H6523 Chronic serous otitis media, bilateral: Secondary | ICD-10-CM | POA: Diagnosis not present

## 2016-09-01 DIAGNOSIS — K580 Irritable bowel syndrome with diarrhea: Secondary | ICD-10-CM | POA: Diagnosis not present

## 2016-09-01 DIAGNOSIS — I159 Secondary hypertension, unspecified: Secondary | ICD-10-CM | POA: Diagnosis not present

## 2016-09-01 DIAGNOSIS — K589 Irritable bowel syndrome without diarrhea: Secondary | ICD-10-CM | POA: Insufficient documentation

## 2016-09-01 DIAGNOSIS — J329 Chronic sinusitis, unspecified: Secondary | ICD-10-CM | POA: Diagnosis not present

## 2016-09-01 DIAGNOSIS — I10 Essential (primary) hypertension: Secondary | ICD-10-CM | POA: Diagnosis not present

## 2016-09-01 MED ORDER — ELUXADOLINE 75 MG PO TABS: 75.0000 mg | ORAL_TABLET | Freq: Two times a day (BID) | ORAL | 5 refills | Status: DC

## 2016-09-01 NOTE — Patient Instructions (Signed)
SEE YOUR PCP AND NEPHROLOGY FOR BP MANAGEMENT. YOU SHOULD CONSIDER GOING TO THE ED TODAY.  TAKE 1/2 CLONIDINE IF NEEDED DAILY TO PREVENT DIASTOLIC BLOOD PRESSURES(THE BOTTOM NUMBER) OVER 95.  DRINK WATER TO KEEP YOUR URINE LIGHT YELLOW.  FOLLOW A HIGH FIBER DIET. AVOID ITEMS THAT CAUSE BLOATING & GAS.  CONTINUE VIBERZI.  STOP IRON. RECHECK BLOOD COUNT AND IRON STORES IN 3 MOS.  FOLLOW UP IN 6 MOS.

## 2016-09-01 NOTE — Progress Notes (Signed)
Subjective:    Patient ID: Patricia Keith, female    DOB: 1961/03/16, 56 y.o.   MRN: [id number]  Colette RibasGOLDING, JOHN CABOT, MD   HPI Doing good. Viberzi QD really works. BMS: DAILY (#4) USED TO BE A #7. VIBERZI STARTED MAKING A DIFFERENCE OVER A COUPLE OF WEEKS. NO ETOH. OCCASIONAL HEARTBURN: 1-2X/MO. COMBINATION OF AUGMENTIN AND VIBERZI HELPED CHANGE STOOLS TO NORMAL. BP BEEN A  PROBLEM FOR 2-3 YEARS. HAS CUFF AT HOME. AT PCP BP WAS 187/100  PT DENIES FEVER, CHILLS, HEMATOCHEZIA, HEMATEMESIS, nausea, vomiting, melena, diarrhea, CHEST PAIN, SHORTNESS OF BREATH, constipation, abdominal pain, problems swallowing, OR problems with sedation.  Past Medical History:  Diagnosis Date  . COPD (chronic obstructive pulmonary disease) (HCC)    DX 2015  . Hypertension   . Meniere disease 2015  . Migraines    Past Surgical History:  Procedure Laterality Date  . APPENDECTOMY    . BACTERIAL OVERGROWTH TEST N/A 01/28/2016   Procedure: BACTERIAL OVERGROWTH TEST;  Surgeon: West BaliSandi L Marlea Gambill, MD;  Location: [facility];  Service: Endoscopy;  Laterality: N/A;  0700  . COLONOSCOPY N/A 08/31/2015   SLF: 1. one rectal polyp removed  2. mild diverticulosis in the sigmoid colon 3. small internal hemorrhoids    . TONSILLECTOMY     No Known Allergies  Current Outpatient Prescriptions  Medication Sig Dispense Refill  . albuterol inhaler Inhale Q6H PRN for wheezing or shortness of breath.    Marland Kitchen. aspirin 81 MG tablet Take 81 mg by mouth daily.    Marland Kitchen. b complex vitamins tablet Take 1 tablet by mouth daily.    . Cholecalciferol (VITAMIN D PO) Take 1 capsule by mouth daily.    . cloNIDine 0.2 MG tablet Take 0.5 tablets (0.1 mg total) by mouth TID     . diazepam (VALIUM) 2 MG tablet Take 2 mg by mouth every 6 (six) hours as needed for anxiety.    Marland Kitchen. VIBERZI 75 MG TABS Take 75 mg by mouth 2 (two) times daily.    Marland Kitchen. LEXAPRO 20 MG tablet Take 20 mg by mouth daily.    . famotidine 20 MG tablet     . Ferrous Sulfate Take 1  tablet by mouth daily.    . fluticasone 50 MCG spray Place into both nostrils daily.    . hydrochlorothiazide (25 MG tablet Take 25 mg by mouth daily.    Marland Kitchen. ibuprofen 200 MG tablet Take 400 mg by mouth every 6 (six) hours as needed for moderate pain.    Marland Kitchen. LORazepam  0.5 MG tablet Take 0.5 mg by mouth daily. For anxiety   Usually takes once daily    . losartan 100 MG tablet Take 100 mg by mouth daily.    . meclizine  25 MG tablet Take one every 6 hours for dizziness (Patient taking differently: Take 25 mg by mouth every 6 (six) hours as needed for dizziness. Take one every 6 hours for dizziness)    . metoprolol 50 MG 24 hr tablet Take 50 mg by mouth daily.    . pantoprazole 40 MG tablet TAKE 1 TABLET BY MOUTH EVERY DAY 30 TO 60 MINUTES BEFORE THE FIRST MEAL OF THE DAY    . SYMBICORT 80-4.5  inhaler INHALE 2 PUFFS  BID    .      .      .      .      . furosemide (LASIX) 20 MG tablet Take 20 mg  by mouth daily.    .      . traZODone (DESYREL) 50 MG tablet Take 50 mg by mouth at bedtime as needed for sleep.     Review of Systems PER HPI OTHERWISE ALL SYSTEMS ARE NEGATIVE.    Objective:   Physical Exam  Constitutional: She is oriented to person, place, and time. She appears well-developed and well-nourished. No distress.  HENT:  Head: Normocephalic and atraumatic.  Mouth/Throat: Oropharynx is clear and moist. No oropharyngeal exudate.  Eyes: Pupils are equal, round, and reactive to light. No scleral icterus.  Neck: Normal range of motion. Neck supple.  Cardiovascular: Normal rate, regular rhythm and normal heart sounds.   Pulmonary/Chest: Effort normal and breath sounds normal. No respiratory distress.  Abdominal: Soft. Bowel sounds are normal. She exhibits no distension. There is no tenderness.  Musculoskeletal: She exhibits no edema.  Lymphadenopathy:    She has no cervical adenopathy.  Neurological: She is alert and oriented to person, place, and time.  Psychiatric: She has a normal  mood and affect.  Vitals reviewed.     Assessment & Plan:

## 2016-09-01 NOTE — Progress Notes (Signed)
cc'ed to pcp °

## 2016-09-01 NOTE — Progress Notes (Signed)
ON RECALL  °

## 2016-09-01 NOTE — Assessment & Plan Note (Signed)
NO BRBPR OR MELENA. NOV 2017  NL Hb  EXPLAINED TO PT SHE SHOULD ONLY TAKE IRO IF SHE NEEDS IT. TOO MUCH IRON INCREASE SUSCEPTIBILITY TO INFECTION. STOP IRON. REPEAT CBC/FERRITIN IN 3 MOS.

## 2016-09-01 NOTE — Progress Notes (Signed)
Patient ID: Patricia Keith, female   DOB: 1960-11-19, 56 y.o.   MRN: [id number] PT AWARE OF ELEVATED DBP AND RECOMMENDATION TO GO TO ED. SHE WOULD LIKE TO GO HOME AND PICK UP HER CLONIDINE AND AVOID THE ED. INSTRUCTED TO TAKE 1/2 CLONIDINE AND CHECK BP IN 30 MINS. IF DBP > 100 SHE SHOULD TAKE ANOTHER 1/2 CLONIDINE.

## 2016-09-01 NOTE — Assessment & Plan Note (Signed)
SYMPTOMS CONTROLLED/RESOLVED AFTER AUGMENTIN BID FOR 5 DAYS AND VIBERZI.  CONTINUE VIBERZI. MED SIDE EFFECTS DISCUSSED. REVIEWED SIGNS/SYMPTOMS OF PANCREATITIS. DRINK WATER EAT FIBER FOLLOW UP IN 6 MOS.

## 2016-09-01 NOTE — Assessment & Plan Note (Signed)
ASSOCIATED WITH HEADACHE.  RECHECK BP BEFORE DISCHARGE (R 238/110, L 222/114). CONSIDER ED VISIT TODAY. CONTINUE BP MONITORING USE 1/2 CLONIDINE ONCE DAILY WHEN DBP > 95

## 2016-09-07 ENCOUNTER — Other Ambulatory Visit: Payer: Self-pay

## 2016-09-07 DIAGNOSIS — D649 Anemia, unspecified: Secondary | ICD-10-CM

## 2016-09-12 ENCOUNTER — Encounter: Payer: Self-pay | Admitting: *Deleted

## 2016-09-14 ENCOUNTER — Telehealth: Payer: Self-pay | Admitting: Cardiovascular Disease

## 2016-09-14 NOTE — Telephone Encounter (Signed)
[facility]30/16 with Dr Allyson SabalBerry.  Records put with Dr Hazle CocaBerry's schedule on 09/30/16. lp

## 2016-09-17 ENCOUNTER — Encounter: Payer: Self-pay | Admitting: Cardiovascular Disease

## 2016-09-20 ENCOUNTER — Encounter: Payer: Self-pay | Admitting: Obstetrics & Gynecology

## 2016-09-20 ENCOUNTER — Other Ambulatory Visit (HOSPITAL_COMMUNITY)
Admission: RE | Admit: 2016-09-20 | Discharge: 2016-09-20 | Disposition: A | Discharge status: Home / Self Care | Payer: BLUE CROSS/BLUE SHIELD | Source: Ambulatory Visit | Attending: Obstetrics & Gynecology | Admitting: Obstetrics & Gynecology

## 2016-09-20 ENCOUNTER — Ambulatory Visit (INDEPENDENT_AMBULATORY_CARE_PROVIDER_SITE_OTHER): Payer: BLUE CROSS/BLUE SHIELD | Admitting: Obstetrics & Gynecology

## 2016-09-20 VITALS — BP 160/100 | HR 74 | Ht 65.0 in | Wt 179.0 lb

## 2016-09-20 DIAGNOSIS — Z01419 Encounter for gynecological examination (general) (routine) without abnormal findings: Secondary | ICD-10-CM | POA: Diagnosis not present

## 2016-09-20 DIAGNOSIS — Z1151 Encounter for screening for human papillomavirus (HPV): Secondary | ICD-10-CM | POA: Insufficient documentation

## 2016-09-20 DIAGNOSIS — Z1212 Encounter for screening for malignant neoplasm of rectum: Secondary | ICD-10-CM

## 2016-09-20 DIAGNOSIS — Z1211 Encounter for screening for malignant neoplasm of colon: Secondary | ICD-10-CM

## 2016-09-20 NOTE — Progress Notes (Signed)
Subjective:     Patricia Keith is a 56 y.o. female here for a routine exam.  No LMP recorded. Patient is postmenopausal. [id number] Birth Control Method:  menopausal Menstrual Calendar(currently): amenorrheic x 6 years  Current complaints: none.   Current acute medical issues:  COPD   Recent Gynecologic History No LMP recorded. Patient is postmenopausal. Last Pap: years ago,  normal Last mammogram: 08/26/2016,  normal  Past Medical History:  Diagnosis Date  . COPD (chronic obstructive pulmonary disease) (HCC)    DX 2015  . Hypertension   . Meniere disease 2015  . Migraines     Past Surgical History:  Procedure Laterality Date  . APPENDECTOMY    . BACTERIAL OVERGROWTH TEST N/A 01/28/2016   Procedure: BACTERIAL OVERGROWTH TEST;  Surgeon: West BaliSandi L Fields, MD;  Location: [facility];  Service: Endoscopy;  Laterality: N/A;  0700  . COLONOSCOPY N/A 08/31/2015   SLF: LYMPHOID POLYP, [place] TICS, SML IH    . TONSILLECTOMY      OB History    Gravida Para Term Preterm AB Living   4 3 3   1      SAB TAB Ectopic Multiple Live Births   1              Social History   Social History  . Marital status: Divorced    Spouse name: N/A  . Number of children: N/A  . Years of education: N/A   Social History Main Topics  . Smoking status: Never Smoker  . Smokeless tobacco: Never Used     Comment: Never smoked  . Alcohol use No  . Drug use: No  . Sexual activity: No   Other Topics Concern  . None   Social History Narrative  . None    Family History  Problem Relation Age of Onset  . Allergies      "all of us"  . COPD Mother   . Breast cancer Mother   . Hypertension Mother   . Asthma Daughter   . Heart disease Father   . Hypertension Father   . Hypertension Paternal Grandfather   . Heart disease Paternal Grandfather   . Hypertension Paternal Grandmother   . Heart disease Paternal Grandmother   . Diabetes Maternal Grandfather      Current Outpatient Prescriptions:  .  b  complex vitamins tablet, Take 1 tablet by mouth daily., Disp: , Rfl:  .  Cholecalciferol (VITAMIN D PO), Take 1 capsule by mouth daily., Disp: , Rfl:  .  diazepam (VALIUM) 2 MG tablet, Take 2 mg by mouth every 6 (six) hours as needed for anxiety., Disp: , Rfl:  .  Eluxadoline (VIBERZI) 75 MG TABS, Take 75 mg by mouth 2 (two) times daily., Disp: 60 tablet, Rfl: 5 .  escitalopram (LEXAPRO) 20 MG tablet, Take 20 mg by mouth daily., Disp: , Rfl:  .  fluticasone (FLONASE) 50 MCG/ACT nasal spray, Place into both nostrils daily., Disp: , Rfl:  .  hydrochlorothiazide (HYDRODIURIL) 25 MG tablet, Take 25 mg by mouth daily., Disp: , Rfl: 3 .  ibuprofen (ADVIL,MOTRIN) 200 MG tablet, Take 400 mg by mouth every 6 (six) hours as needed for moderate pain., Disp: , Rfl:  .  labetalol (NORMODYNE) 200 MG tablet, Take 200 mg by mouth 2 (two) times daily., Disp: , Rfl:  .  LORazepam (ATIVAN) 0.5 MG tablet, Take 0.5 mg by mouth daily. For anxiety   Usually takes once daily, Disp: , Rfl:  .  losartan (COZAAR) 100 MG tablet, Take 100 mg by mouth daily., Disp: , Rfl: 2 .  meclizine (ANTIVERT) 25 MG tablet, Take one every 6 hours for dizziness (Patient taking differently: Take 25 mg by mouth every 6 (six) hours as needed for dizziness. Take one every 6 hours for dizziness), Disp: 28 tablet, Rfl: 0 .  SYMBICORT 80-4.5 MCG/ACT inhaler, INHALE 2 PUFFS INTO THE LUNGS FIRST THING IN THE MORNING, AND THEN INHALE 2 PUFFS INTO THE LUNGS ABOUT 12 HOURS LATER, Disp: 10.2 g, Rfl: 0 .  aspirin 81 MG tablet, Take 81 mg by mouth daily., Disp: , Rfl:   Review of Systems  Review of Systems  Constitutional: Negative for fever, chills, weight loss, malaise/fatigue and diaphoresis.  HENT: Negative for hearing loss, ear pain, nosebleeds, congestion, sore throat, neck pain, tinnitus and ear discharge.   Eyes: Negative for blurred vision, double vision, photophobia, pain, discharge and redness.  Respiratory: Negative for cough, hemoptysis,  sputum production, shortness of breath, wheezing and stridor.   Cardiovascular: Negative for chest pain, palpitations, orthopnea, claudication, leg swelling and PND.  Gastrointestinal: negative for abdominal pain. Negative for heartburn, nausea, vomiting, diarrhea, constipation, blood in stool and melena.  Genitourinary: Negative for dysuria, urgency, frequency, hematuria and flank pain.  Musculoskeletal: Negative for myalgias, back pain, joint pain and falls.  Skin: Negative for itching and rash.  Neurological: Negative for dizziness, tingling, tremors, sensory change, speech change, focal weakness, seizures, loss of consciousness, weakness and headaches.  Endo/Heme/Allergies: Negative for environmental allergies and polydipsia. Does not bruise/bleed easily.  Psychiatric/Behavioral: Negative for depression, suicidal ideas, hallucinations, memory loss and substance abuse. The patient is not nervous/anxious and does not have insomnia.        Objective:  Blood pressure (!) 160/100, pulse 74, height 5\' 5"  (1.651 m), weight 179 lb (81.2 kg).   Physical Exam  Vitals reviewed. Constitutional: She is oriented to person, place, and time. She appears well-developed and well-nourished.  HENT:  Head: Normocephalic and atraumatic.        Right Ear: External ear normal.  Left Ear: External ear normal.  Nose: Nose normal.  Mouth/Throat: Oropharynx is clear and moist.  Eyes: Conjunctivae and EOM are normal. Pupils are equal, round, and reactive to light. Right eye exhibits no discharge. Left eye exhibits no discharge. No scleral icterus.  Neck: Normal range of motion. Neck supple. No tracheal deviation present. No thyromegaly present.  Cardiovascular: Normal rate, regular rhythm, normal heart sounds and intact distal pulses.  Exam reveals no gallop and no friction rub.   No murmur heard. Respiratory: Effort normal and breath sounds normal. No respiratory distress. She has no wheezes. She has no rales.  She exhibits no tenderness.  GI: Soft. Bowel sounds are normal. She exhibits no distension and no mass. There is no tenderness. There is no rebound and no guarding.  Genitourinary:  Breasts no masses skin changes or nipple changes bilaterally      Vulva is normal without lesions Vagina is pink moist without discharge Cervix normal in appearance and pap is done Uterus is normal size shape and contour Adnexa is negative with normal sized ovaries  {Rectal    hemoccult negative, normal tone, no masses  Musculoskeletal: Normal range of motion. She exhibits no edema and no tenderness.  Neurological: She is alert and oriented to person, place, and time. She has normal reflexes. She displays normal reflexes. No cranial nerve deficit. She exhibits normal muscle tone. Coordination normal.  Skin: Skin is warm and  dry. No rash noted. No erythema. No pallor.  Psychiatric: She has a normal mood and affect. Her behavior is normal. Judgment and thought content normal.       Medications Ordered at today's visit: No orders of the defined types were placed in this encounter.   Other orders placed at today's visit: No orders of the defined types were placed in this encounter.     Assessment:    Healthy female exam.    Plan:    Mammogram ordered. Follow up in: 2 years.     Return in about 2 years (around 09/20/2018) for yearly, with Dr Despina Hidden.

## 2016-09-20 NOTE — Addendum Note (Signed)
Addended by: Federico FlakeNES, PEGGY A on: 09/20/2016 11:05 AM   Modules accepted: Orders

## 2016-09-22 LAB — CYTOLOGY - PAP
Diagnosis: NEGATIVE
HPV: NOT DETECTED

## 2016-09-28 ENCOUNTER — Other Ambulatory Visit: Payer: Self-pay | Admitting: Gastroenterology

## 2016-09-30 ENCOUNTER — Ambulatory Visit: Payer: BLUE CROSS/BLUE SHIELD | Admitting: Cardiovascular Disease

## 2016-10-04 ENCOUNTER — Ambulatory Visit (INDEPENDENT_AMBULATORY_CARE_PROVIDER_SITE_OTHER): Payer: BLUE CROSS/BLUE SHIELD | Admitting: Cardiovascular Disease

## 2016-10-04 ENCOUNTER — Encounter: Payer: Self-pay | Admitting: Cardiovascular Disease

## 2016-10-04 VITALS — BP 218/114 | HR 71 | Ht 65.0 in | Wt 181.4 lb

## 2016-10-04 DIAGNOSIS — I701 Atherosclerosis of renal artery: Secondary | ICD-10-CM

## 2016-10-04 MED ORDER — HYDRALAZINE HCL 25 MG PO TABS: 25.0000 mg | ORAL_TABLET | Freq: Three times a day (TID) | ORAL | 3 refills | Status: DC

## 2016-10-04 NOTE — Progress Notes (Signed)
10/04/2016 Patricia Keith   [birth year]  [id number]  Primary Physician Colette RibasGOLDING, JOHN CABOT, MD Primary Cardiologist: Runell GessJonathan J Satvik Parco MD Roseanne RenoFACP, FACC, FAHA, FSCAI  HPI:  Ms. Patricia Keith is a delightful 56 year old mildly overweight divorced Caucasian female mother of 3 children, and her mother to grandchildren referred by Dr. Phillips OdorGolding and Sherwood GamblerFusco for evaluation of resistant hypertension. She has had hypertension for over 5 years. She's never had a heart attack or stroke. Her father did have a myocardial infarction and stents. Dr. Mayford Knifeurner performed a routine GXT on her several years ago. She has atypical chest pain radiating to her back which she feels is related to reflux. She is on multiple antihypertensive medications with variable results.   Current Outpatient Prescriptions  Medication Sig Dispense Refill  . aspirin 81 MG tablet Take 81 mg by mouth daily.    Marland Kitchen. b complex vitamins tablet Take 1 tablet by mouth daily.    . Cholecalciferol (VITAMIN D PO) Take 1 capsule by mouth daily.    . diazepam (VALIUM) 2 MG tablet Take 2 mg by mouth every 6 (six) hours as needed for anxiety.    Marland Kitchen. escitalopram (LEXAPRO) 20 MG tablet Take 20 mg by mouth daily.    . fluticasone (FLONASE) 50 MCG/ACT nasal spray Place into both nostrils daily.    . hydrochlorothiazide (HYDRODIURIL) 25 MG tablet Take 25 mg by mouth daily.  3  . ibuprofen (ADVIL,MOTRIN) 200 MG tablet Take 400 mg by mouth every 6 (six) hours as needed for moderate pain.    Marland Kitchen. labetalol (NORMODYNE) 200 MG tablet Take 200 mg by mouth 2 (two) times daily.    Marland Kitchen. LORazepam (ATIVAN) 0.5 MG tablet Take 0.5 mg by mouth daily. For anxiety   Usually takes once daily    . losartan (COZAAR) 100 MG tablet Take 100 mg by mouth daily.  2  . meclizine (ANTIVERT) 25 MG tablet Take one every 6 hours for dizziness (Patient taking differently: Take 25 mg by mouth every 6 (six) hours as needed for dizziness. Take one every 6 hours for dizziness) 28 tablet 0  . SYMBICORT 80-4.5  MCG/ACT inhaler INHALE 2 PUFFS INTO THE LUNGS FIRST THING IN THE MORNING, AND THEN INHALE 2 PUFFS INTO THE LUNGS ABOUT 12 HOURS LATER 10.2 g 0  . VIBERZI 75 MG TABS TAKE 1 TABLET BY MOUTH TWICE DAILY 60 tablet 5   No current facility-administered medications for this visit.     No Known Allergies  Social History   Social History  . Marital status: Divorced    Spouse name: N/A  . Number of children: N/A  . Years of education: N/A   Occupational History  . Not on file.   Social History Main Topics  . Smoking status: Never Smoker  . Smokeless tobacco: Never Used     Comment: Never smoked  . Alcohol use No  . Drug use: No  . Sexual activity: No   Other Topics Concern  . Not on file   Social History Narrative  . No narrative on file     Review of Systems: General: negative for chills, fever, night sweats or weight changes.  Cardiovascular: negative for chest pain, dyspnea on exertion, edema, orthopnea, palpitations, paroxysmal nocturnal dyspnea or shortness of breath Dermatological: negative for rash Respiratory: negative for cough or wheezing Urologic: negative for hematuria Abdominal: negative for nausea, vomiting, diarrhea, bright red blood per rectum, melena, or hematemesis Neurologic: negative for visual changes, syncope, or dizziness All  other systems reviewed and are otherwise negative except as noted above.    Blood pressure (!) 218/114, pulse 71, height 5\' 5"  (1.651 m), weight 181 lb 6.4 oz (82.3 kg).  General appearance: alert and no distress Neck: no adenopathy, no carotid bruit, no JVD, supple, symmetrical, trachea midline and thyroid not enlarged, symmetric, no tenderness/mass/nodules Lungs: clear to auscultation bilaterally Heart: regular rate and rhythm, S1, S2 normal, no murmur, click, rub or gallop Extremities: extremities normal, atraumatic, no cyanosis or edema  EKG sinus rhythm at 71 without ST or T-wave changes. I personally reviewed this  EKG.  ASSESSMENT AND PLAN:   Uncontrolled hypertension History of resistant hypertension on multiple medications over the last 5 years. Her blood pressure today is 218/114. She checks her blood pressure and it runs in the 160/95 range although there are normal readings as well. She is on labetalol, losartan and hydrochlorothiazide. She has had a 24-hour urine performed I presume to rule out pheochromocytoma. I'm going to add hydralazine 25 mg by mouth 3 times a day and have her keep a blood pressure log daily from month. She'll see Belenda Cruise back after that for review and titration. We will also get renal Doppler studies rule out renal vascular hypertension.      Runell Gess MD FACP,FACC,FAHA, [facility] 10/04/2016 3:28 PM

## 2016-10-04 NOTE — Assessment & Plan Note (Signed)
History of resistant hypertension on multiple medications over the last 5 years. Her blood pressure today is 218/114. She checks her blood pressure and it runs in the 160/95 range although there are normal readings as well. She is on labetalol, losartan and hydrochlorothiazide. She has had a 24-hour urine performed I presume to rule out pheochromocytoma. I'm going to add hydralazine 25 mg by mouth 3 times a day and have her keep a blood pressure log daily from month. She'll see Belenda CruiseKristin back after that for review and titration. We will also get renal Doppler studies rule out renal vascular hypertension.

## 2016-10-04 NOTE — Patient Instructions (Signed)
Medication Instructions: START Hydralazine 25mg  three times daily.   Testing/Procedures: Your physician has requested that you have a renal artery duplex. During this test, an ultrasound is used to evaluate blood flow to the kidneys. Allow one hour for this exam. Do not eat after midnight the day before and avoid carbonated beverages. Take your medications as you usually do.  Follow-Up: Your physician recommends that you schedule a follow-up appointment in: 1 month with [facility] for BP check and medication titration. Your physician has requested that you regularly monitor and record your blood pressure readings at home. Please use the same machine at the same time of day to check your readings and record them to bring to your follow-up visit.  Your physician recommends that you schedule a follow-up appointment in: 2 months with Dr. Allyson SabalBerry.  If you need a refill on your cardiac medications before your next appointment, please call your pharmacy.

## 2016-10-06 ENCOUNTER — Ambulatory Visit: Payer: BLUE CROSS/BLUE SHIELD

## 2016-10-06 NOTE — Addendum Note (Signed)
Addended by: Chana BodeGREEN, Nicol Herbig L on: 10/06/2016 08:05 AM   Modules accepted: Orders

## 2016-10-27 ENCOUNTER — Other Ambulatory Visit: Payer: Self-pay

## 2016-10-27 DIAGNOSIS — D649 Anemia, unspecified: Secondary | ICD-10-CM

## 2016-11-10 ENCOUNTER — Ambulatory Visit (INDEPENDENT_AMBULATORY_CARE_PROVIDER_SITE_OTHER): Payer: BLUE CROSS/BLUE SHIELD | Admitting: Pharmacist

## 2016-11-10 ENCOUNTER — Ambulatory Visit (HOSPITAL_COMMUNITY)
Admission: RE | Admit: 2016-11-10 | Discharge: 2016-11-10 | Disposition: A | Discharge status: Home / Self Care | Payer: BLUE CROSS/BLUE SHIELD | Source: Ambulatory Visit | Attending: Cardiovascular Disease | Admitting: Cardiovascular Disease

## 2016-11-10 VITALS — BP 152/80 | HR 64

## 2016-11-10 DIAGNOSIS — I701 Atherosclerosis of renal artery: Secondary | ICD-10-CM | POA: Diagnosis not present

## 2016-11-10 DIAGNOSIS — I10 Essential (primary) hypertension: Secondary | ICD-10-CM

## 2016-11-10 MED ORDER — VALSARTAN 80 MG PO TABS: 80.0000 mg | ORAL_TABLET | Freq: Two times a day (BID) | ORAL | 1 refills | Status: DC

## 2016-11-10 MED ORDER — CHLORTHALIDONE 25 MG PO TABS: 25.0000 mg | ORAL_TABLET | Freq: Every day | ORAL | 1 refills | Status: DC

## 2016-11-10 NOTE — Patient Instructions (Addendum)
Return for a a follow up appointment in 2 weeks  Your blood pressure today is 152/80 pulse 64  Check your blood pressure at home daily (if able) and keep record of the readings.  Take your BP meds as follows: STOP taking hydrichlorothiazine  daily STOP taking losartan  daily START valsartan  twice daily START Chlortahidone  daily **Hydralazine  three times daily (may take and extra  as needed if BP above 160/100)** Labetalol  twice daily  **Additional recommendation: 1. Use Flonase 1 spray twice daily 2. Use normal saline for nasal congestion  Bring your BP cuff and your record of home blood pressures to your next appointment.  Exercise as you're able, try to walk approximately 30 minutes per day.  Keep salt intake to a minimum, especially watch canned and prepared boxed foods.  Eat more fresh fruits and vegetables and fewer canned items.  Avoid eating in fast food restaurants.    HOW TO TAKE YOUR BLOOD PRESSURE: . Rest 5 minutes before taking your blood pressure. .  Don't smoke or drink caffeinated beverages for at least 30 minutes before. . Take your blood pressure before (not after) you eat. . Sit comfortably with your back supported and both feet on the floor (don't cross your legs). . Elevate your arm to heart level on a table or a desk. . Use the proper sized cuff. It should fit smoothly and snugly around your bare upper arm. There should be enough room to slip a fingertip under the cuff. The bottom edge of the cuff should be 1 inch above the crease of the elbow. . Ideally, take 3 measurements at one sitting and record the average.

## 2016-11-10 NOTE — Progress Notes (Signed)
Patient ID: Patricia Keith                 DOB: 12/10/1960                      MRN: [id number]     HPI:  Patricia Keith is a 56 y.o. female referred by Dr. Allyson Sabal to HTN clinic.  PMH includes hypertension, atypical chest pain, and migraines. Renal artery ultrasound to be performed today for renal stenosis assessment.  Also noted patient takes diazepam  every 6 hours as need for Meniere disease. Patient presents to clinic today for blood pressure evaluation. Reports frequent headaches and takes Excedrin migraine OTC as needed. Stopped taking Ibuprofen and all other NSAIDs. Also reports 2 episodes of light-headedness associated to hypotension during the last month.   Patient provided a very detailed record of home blood pressure measurement and was noted he had 2 episodes of extremely elevated BP (240/120 and 183/104) that woke her up with migraine type headaches.    Current HTN meds:  HCTZ  daily Hydralazine  three times daily Labetalol  twice daily Losartan  daily  Previously tried:  Atenolol  daily - non-available at the time Metoprolol - not effective changed to labetalol  Clonidine 0.2mg  twice daily - changed therapy Furosemide  daily - stopped after swelling resolved Amlodipine  - low extremity swelling  BP goal: 130/80  Family History:  Significant cardiac history includes hypertension from mother and father, MI for his father and SVTs from mother side.  Social History: denies tobacco use, and alcohol use  Diet: low sodium diet, heart healthy diet  Exercise: daily walks for ~30 minutes  Home BP readings:  36 readings (all ~ 1:30 pm);  126/74 average with lowerst readings 81/51 &89/56 - lightheaded and weak **High readings 240/120 & 183/104 - woke her up with sever headache and nausea (not including on daily average)**  Wt Readings from Last 3 Encounters:  10/04/16 181 lb 6.4 oz (82.3 kg)  09/20/16 179 lb (81.2 kg)  09/01/16 180 lb (81.6 kg)    BP Readings from Last 3 Encounters:  11/10/16 (!) 152/80  10/04/16 (!) 218/114  09/20/16 (!) 160/100   Pulse Readings from Last 3 Encounters:  11/10/16 64  10/04/16 71  09/20/16 74    Past Medical History:  Diagnosis Date  . COPD (chronic obstructive pulmonary disease) (HCC)    DX 2015  . Hypertension   . Meniere disease 2015  . Migraines     Current Outpatient Prescriptions on File Prior to Visit  Medication Sig Dispense Refill  . aspirin 81 MG tablet Take 81 mg by mouth daily.    Marland Kitchen b complex vitamins tablet Take 1 tablet by mouth daily.    . Cholecalciferol (VITAMIN D PO) Take 1 capsule by mouth daily.    . diazepam (VALIUM) 2 MG tablet Take 2 mg by mouth every 6 (six) hours as needed for anxiety.    Marland Kitchen escitalopram (LEXAPRO) 20 MG tablet Take 20 mg by mouth daily.    . fluticasone (FLONASE) 50 MCG/ACT nasal spray Place into both nostrils daily.    . hydrALAZINE (APRESOLINE) 25 MG tablet Take 1 tablet (25 mg total) by mouth 3 (three) times daily. 90 tablet 3  . ibuprofen (ADVIL,MOTRIN) 200 MG tablet Take 400 mg by mouth every 6 (six) hours as needed for moderate pain.    Marland Kitchen labetalol (NORMODYNE) 200 MG tablet Take 200 mg by mouth 2 (two)  times daily.    Marland Kitchen LORazepam (ATIVAN) 0.5 MG tablet Take 0.5 mg by mouth daily. For anxiety   Usually takes once daily    . meclizine (ANTIVERT) 25 MG tablet Take one every 6 hours for dizziness (Patient taking differently: Take 25 mg by mouth every 6 (six) hours as needed for dizziness. Take one every 6 hours for dizziness) 28 tablet 0  . SYMBICORT 80-4.5 MCG/ACT inhaler INHALE 2 PUFFS INTO THE LUNGS FIRST THING IN THE MORNING, AND THEN INHALE 2 PUFFS INTO THE LUNGS ABOUT 12 HOURS LATER 10.2 g 0  . VIBERZI 75 MG TABS TAKE 1 TABLET BY MOUTH TWICE DAILY 60 tablet 5   No current facility-administered medications on file prior to visit.     No Known Allergies  Blood pressure (!) 152/80, pulse 64, SpO2 95 %.  Uncontrolled hypertension:   Blood pressure today in office remains slightly above desired goal of 130/80 but significantly improved from previous readings.  Home reading at goal with an average of 126/74.  Noted patient experienced 2 episodes of symptomatic hypotension during the month.  Also noted 2 episodes of extremely elevated blood pressure during the evening hours that woke her up from sleep with sever migraine type headaches.  All home reading were taken at the same time of the day except for the 2 extremely high readings during night.  We are unable to determine if Patricia Keith is experiencing elevated BP causing HA or migraine causing elevated BP readings.  Will change losartan  to valsartan  (  in AM and  in PM) and change HCTZ to chlorthalidone  for better BP control during the 24 hours.  Labetalol will continue without changes and hydralazine to continue at  three times per day (patient may take hydralazine  PRN if BP >160/100).  Patient was instructed to call clinic if extreme elevated BP noted of if questions.  Follow up in 2 weeks to assess tolerability of new medication and to draw BMET.  Patricia Keith PharmD, BCPS [facility] [address] [place] [address] 11/10/2016 8:11 PM

## 2016-11-11 ENCOUNTER — Telehealth: Payer: Self-pay | Admitting: Pharmacist Clinician (PhC)/ Clinical Pharmacy Specialist

## 2016-11-11 MED ORDER — VALSARTAN 160 MG PO TABS: 80.0000 mg | ORAL_TABLET | Freq: Two times a day (BID) | ORAL | 5 refills | Status: DC

## 2016-11-11 NOTE — Telephone Encounter (Signed)
Insurance won't cover 80 mg BID.   Will have patient take 160 mg 1/2 tab bid instead,  Rx sent to [facility]

## 2016-11-23 NOTE — Progress Notes (Signed)
Patient ID: Patricia Keith                 DOB: 1961/04/13                      MRN: [id number]     HPI:  Patricia Keith is a 56 y.o. female referred by Dr. Allyson Sabal to HTN clinic.  PMH includes hypertension, atypical chest pain, and migraines. Renal artery ultrasound done 11/10/16 to r/o stenosis.  Also noted patient takes diazepam  every 6 hours as need for Meniere disease. Patient presents to clinic today for blood pressure follow up. Reports decrease in headaches frequency but experiencing occasional dizziness. Denies swelling, shortness of breath or any other ADR related to medication.  Medical insurance will not cover valsartan prescription, so she continued her losartan  (  in AM and  in PM). BMET not done yet but patient stated she will complete blood work on May/2 for GI doctor and BEMT will be included.  Current HTN meds:  Chlorthalidone  daily Hydralazine  twice daily (may take  as needed if BP >160/100) Labetalol  twice daily Losartan  daily (  in AM and  in PM)  Previously tried:  Atenolol  daily - non-available at the time Metoprolol - not effective changed to labetalol  Clonidine 0.2mg  twice daily - changed therapy Furosemide  daily - stopped after swelling resolved Amlodipine  - low extremity swelling HCTZ  daily  BP goal: 130/80  Family History:  Significant cardiac history includes hypertension from mother and father, MI for his father and SVTs from mother side.  Social History: denies tobacco use, and alcohol use  Diet: low sodium diet, heart healthy diet  Exercise: daily walks for ~30 minutes  Home BP readings:  14 readings in AM; average 130/76 (range 100-165/59-105); pulse 61-111bpm 13 readings in PM; average 112/63 (range 92-130/47-84); pulse 63-77bpm  Wt Readings from Last 3 Encounters:  10/04/16 181 lb 6.4 oz (82.3 kg)  09/20/16 179 lb (81.2 kg)  09/01/16 180 lb (81.6 kg)   BP Readings from Last 3  Encounters:  11/24/16 112/70  11/10/16 (!) 152/80  10/04/16 (!) 218/114   Pulse Readings from Last 3 Encounters:  11/24/16 65  11/10/16 64  10/04/16 71    Past Medical History:  Diagnosis Date  . COPD (chronic obstructive pulmonary disease) (HCC)    DX 2015  . Hypertension   . Meniere disease 2015  . Migraines     Current Outpatient Prescriptions on File Prior to Visit  Medication Sig Dispense Refill  . aspirin 81 MG tablet Take 81 mg by mouth daily.    Marland Kitchen b complex vitamins tablet Take 1 tablet by mouth daily.    . chlorthalidone (HYGROTON) 25 MG tablet Take 1 tablet (25 mg total) by mouth daily. 30 tablet 1  . Cholecalciferol (VITAMIN D PO) Take 1 capsule by mouth daily.    . diazepam (VALIUM) 2 MG tablet Take 2 mg by mouth every 6 (six) hours as needed for anxiety.    Marland Kitchen escitalopram (LEXAPRO) 20 MG tablet Take 20 mg by mouth daily.    . fluticasone (FLONASE) 50 MCG/ACT nasal spray Place into both nostrils daily.    . hydrALAZINE (APRESOLINE) 25 MG tablet Take 1 tablet (25 mg total) by mouth 3 (three) times daily. 90 tablet 3  . ibuprofen (ADVIL,MOTRIN) 200 MG tablet Take 400 mg by mouth every 6 (six) hours as needed for moderate pain.    Marland Kitchen  labetalol (NORMODYNE) 200 MG tablet Take 200 mg by mouth 2 (two) times daily.    Marland Kitchen LORazepam (ATIVAN) 0.5 MG tablet Take 0.5 mg by mouth daily. For anxiety   Usually takes once daily    . meclizine (ANTIVERT) 25 MG tablet Take one every 6 hours for dizziness (Patient taking differently: Take 25 mg by mouth every 6 (six) hours as needed for dizziness. Take one every 6 hours for dizziness) 28 tablet 0  . SYMBICORT 80-4.5 MCG/ACT inhaler INHALE 2 PUFFS INTO THE LUNGS FIRST THING IN THE MORNING, AND THEN INHALE 2 PUFFS INTO THE LUNGS ABOUT 12 HOURS LATER 10.2 g 0  . valsartan (DIOVAN) 160 MG tablet Take 0.5 tablets (80 mg total) by mouth 2 (two) times daily. 30 tablet 5  . VIBERZI 75 MG TABS TAKE 1 TABLET BY MOUTH TWICE DAILY 60 tablet 5   No  current facility-administered medications on file prior to visit.     No Known Allergies  Blood pressure 112/70, pulse 65.  Uncontrolled hypertension:  Blood pressure today is at goal of <130/80.  Noted BP readings at home also better controlled with a decrease in HA frequency.  Patient reports few episodes of dizziness and few BP readings with systolic BP in the 90s at mid-day.   Will change hydralazine to  twice daily and continue all other medication as prescribed.  Patient also instructed to take hydralazine  as needed if BP >160/100.  Patient to get blood work done next week and follow up with cardiologist in 2 weeks as previously scheduled.  Next F/U with HNT clinic in 6 weeks.  Oaklyn Mans Rodriguez-Guzman PharmD, BCPS [facility] [address] [place] [address] 11/24/2016 7:35 AM

## 2016-11-24 ENCOUNTER — Ambulatory Visit (INDEPENDENT_AMBULATORY_CARE_PROVIDER_SITE_OTHER): Payer: BLUE CROSS/BLUE SHIELD | Admitting: Pharmacist

## 2016-11-24 VITALS — BP 112/70 | HR 65

## 2016-11-24 DIAGNOSIS — I10 Essential (primary) hypertension: Secondary | ICD-10-CM | POA: Diagnosis not present

## 2016-11-24 MED ORDER — HYDRALAZINE HCL 25 MG PO TABS: ORAL_TABLET | ORAL | 1 refills | Status: DC

## 2016-11-24 MED ORDER — LOSARTAN POTASSIUM 50 MG PO TABS: 50.0000 mg | ORAL_TABLET | Freq: Two times a day (BID) | ORAL | 1 refills | Status: DC

## 2016-11-24 NOTE — Patient Instructions (Addendum)
Return for a a follow up appointment in 6 weeks  Your blood pressure today is  112/70 pulse 65  Check your blood pressure at home daily (if able) and keep record of the readings.  Take your BP meds as follows: Chlorthalidone  daily Labetalol  twice daily Losartan  twice daily (  in AM and  in PM)  **DECREASE Hydralazine  to twice daily (may take  as needed if BP >160/100)**  Bring your record of home blood pressures to your next appointment.  Exercise as you're able, try to walk approximately 30 minutes per day.  Keep salt intake to a minimum, especially watch canned and prepared boxed foods.  Eat more fresh fruits and vegetables and fewer canned items.  Avoid eating in fast food restaurants.    HOW TO TAKE YOUR BLOOD PRESSURE: . Rest 5 minutes before taking your blood pressure. .  Don't smoke or drink caffeinated beverages for at least 30 minutes before. . Take your blood pressure before (not after) you eat. . Sit comfortably with your back supported and both feet on the floor (don't cross your legs). . Elevate your arm to heart level on a table or a desk. . Use the proper sized cuff. It should fit smoothly and snugly around your bare upper arm. There should be enough room to slip a fingertip under the cuff. The bottom edge of the cuff should be 1 inch above the crease of the elbow. . Ideally, take 3 measurements at one sitting and record the average.

## 2016-11-29 ENCOUNTER — Other Ambulatory Visit: Payer: Self-pay | Admitting: Cardiovascular Disease

## 2016-11-29 DIAGNOSIS — D649 Anemia, unspecified: Secondary | ICD-10-CM | POA: Diagnosis not present

## 2016-11-29 DIAGNOSIS — I10 Essential (primary) hypertension: Secondary | ICD-10-CM | POA: Diagnosis not present

## 2016-11-29 LAB — CBC WITH DIFFERENTIAL/PLATELET
BASOS ABS: 54 {cells}/uL (ref 0–200)
Basophils Relative: 1 %
EOS PCT: 3 %
Eosinophils Absolute: 162 cells/uL (ref 15–500)
HEMATOCRIT: 37.4 % (ref 35.0–45.0)
HEMOGLOBIN: 12.1 g/dL (ref 11.7–15.5)
LYMPHS ABS: 1674 {cells}/uL (ref 850–3900)
Lymphocytes Relative: 31 %
MCH: 26.6 pg — AB (ref 27.0–33.0)
MCHC: 32.4 g/dL (ref 32.0–36.0)
MCV: 82.2 fL (ref 80.0–100.0)
MPV: 9.8 fL (ref 7.5–12.5)
Monocytes Absolute: 378 cells/uL (ref 200–950)
Monocytes Relative: 7 %
NEUTROS ABS: 3132 {cells}/uL (ref 1500–7800)
NEUTROS PCT: 58 %
Platelets: 326 10*3/uL (ref 140–400)
RBC: 4.55 MIL/uL (ref 3.80–5.10)
RDW: 14 % (ref 11.0–15.0)
WBC: 5.4 10*3/uL (ref 3.8–10.8)

## 2016-11-29 LAB — BASIC METABOLIC PANEL
BUN: 18 mg/dL (ref 7–25)
CHLORIDE: 103 mmol/L (ref 98–110)
CO2: 26 mmol/L (ref 20–31)
CREATININE: 0.88 mg/dL (ref 0.50–1.05)
Calcium: 9.1 mg/dL (ref 8.6–10.4)
GLUCOSE: 99 mg/dL (ref 65–99)
Potassium: 4.1 mmol/L (ref 3.5–5.3)
Sodium: 139 mmol/L (ref 135–146)

## 2016-11-29 LAB — FERRITIN: FERRITIN: 49 ng/mL (ref 10–232)

## 2016-12-06 ENCOUNTER — Ambulatory Visit (INDEPENDENT_AMBULATORY_CARE_PROVIDER_SITE_OTHER): Payer: BLUE CROSS/BLUE SHIELD | Admitting: Cardiovascular Disease

## 2016-12-06 ENCOUNTER — Encounter: Payer: Self-pay | Admitting: Cardiovascular Disease

## 2016-12-06 DIAGNOSIS — I10 Essential (primary) hypertension: Secondary | ICD-10-CM

## 2016-12-06 NOTE — Patient Instructions (Signed)
Your physician recommends that you schedule a follow-up appointment in 3 months with a NP/PA.  Dr Allyson SabalBerry recommends that you schedule a follow-up appointment in 6 months. You will receive a reminder letter in the mail two months in advance. If you don't receive a letter, please call our office to schedule the follow-up appointment.  If you need a refill on your cardiac medications before your next appointment, please call your pharmacy.

## 2016-12-06 NOTE — Progress Notes (Signed)
Blood pressure better controlled on her current medical regimen. Renal Dopplers were negative for renal vascular hypertension. I reviewed her blood pressure log and significantly improved and she feels clinically improved as well. She will continue to see our pharmacist for blood pressure evaluation and titration. I will see her back in 6 months. Continue current medicines at current dosing

## 2016-12-06 NOTE — Assessment & Plan Note (Signed)
Blood pressure better controlled on her current medical regimen. Renal Dopplers were negative for renal vascular hypertension. I reviewed her blood pressure log and significantly improved and she feels clinically improved as well. She will continue to see our pharmacist for blood pressure evaluation and titration. I will see her back in 6 months. Continue current medicines at current dosing 

## 2016-12-22 ENCOUNTER — Other Ambulatory Visit: Payer: Self-pay | Admitting: Cardiovascular Disease

## 2016-12-22 NOTE — Telephone Encounter (Signed)
Rx has been sent to the pharmacy electronically. ° °

## 2017-01-02 ENCOUNTER — Encounter: Payer: Self-pay | Admitting: Gastroenterology

## 2017-01-04 NOTE — Progress Notes (Signed)
Patient ID: STARLINA LAPRE                 DOB: 27-Aug-1960                      MRN: [id number]     HPI:  Patricia Keith is a 56 y.o. female referred by Dr. Allyson Sabal to HTN clinic.  Her medical history is significant for hypertension, atypical chest pain, migraines and Menier's disease (for which she takes diazepam prn). Renal artery ultrasound done in April of this year were negative for renovascular hypertension.  When she saw Dr. Allyson Sabal last month her pressure was good at 124/72.  He asked that she follow up again with CVRR clinic to be sure still doing well.    Her most recent lab work shows stable kidney function and electrolytes.    Today she denies swelling or shortness of breath.  She does note some orthostatic symptoms as well as some dizziness when she is seated and her pressure drops.  This usually resolves on its own in just a minute or two.  Current HTN meds:   Chlorthalidone 25mg  daily  Hydralazine 25mg  twice daily (may take 25mg  as needed if BP >160/100)  Labetalol 200mg  twice daily  Losartan 100mg  daily (50mg  in AM and 50mg  in PM) (valsartan not covered on insurance)  Previously tried:  Atenolol 100mg  daily - non-available at the time Metoprolol - not effective changed to labetalol  Clonidine 0.2mg  twice daily - changed therapy Furosemide 20mg  daily - stopped after swelling resolved Amlodipine 10mg  - low extremity swelling HCTZ 25mg  daily  BP goal: 130/80  Family History:  Significant cardiac history includes hypertension from mother and father, MI for his father and SVTs from mother side.  Social History: denies tobacco use, and alcohol use  Diet: low sodium diet, heart healthy diet  Exercise: daily walks for ~30 minutes, not as much recently, having some leg cramps  Home BP readings:  25 readings in AM; average 120/73 (range 96-150/45-106); pulse 66-90 bpm 24 readings in PM; average 101/57 (range 94-129/46-89); pulse 66-82 bpm  Wt Readings from Last 3 Encounters:    12/06/16 185 lb 3.2 oz (84 kg)  10/04/16 181 lb 6.4 oz (82.3 kg)  09/20/16 179 lb (81.2 kg)   BP Readings from Last 3 Encounters:  01/05/17 124/74  12/06/16 124/72  11/24/16 112/70   Pulse Readings from Last 3 Encounters:  01/05/17 60  12/06/16 75  11/24/16 65    Past Medical History:  Diagnosis Date  . COPD (chronic obstructive pulmonary disease) (HCC)    DX 2015  . Hypertension   . Meniere disease 2015  . Migraines     Current Outpatient Prescriptions on File Prior to Visit  Medication Sig Dispense Refill  . aspirin 81 MG tablet Take 81 mg by mouth daily.    Marland Kitchen b complex vitamins tablet Take 1 tablet by mouth daily.    . chlorthalidone (HYGROTON) 25 MG tablet TAKE 1 TABLET BY MOUTH DAILY 30 tablet 11  . Cholecalciferol (VITAMIN D PO) Take 1 capsule by mouth daily.    . diazepam (VALIUM) 2 MG tablet Take 2 mg by mouth every 6 (six) hours as needed for anxiety.    Marland Kitchen escitalopram (LEXAPRO) 20 MG tablet Take 20 mg by mouth daily.    . fluticasone (FLONASE) 50 MCG/ACT nasal spray Place into both nostrils daily.    . hydrALAZINE (APRESOLINE) 25 MG tablet Take 1 tablet (25mg )  by mouth twice daily (morning and evening).  May take an extra 25mg  if blood pressure above 160/100. 90 tablet 1  . labetalol (NORMODYNE) 200 MG tablet Take 200 mg by mouth 2 (two) times daily.    Marland Kitchen. losartan (COZAAR) 50 MG tablet Take 1 tablet (50 mg total) by mouth 2 (two) times daily. 180 tablet 1  . meclizine (ANTIVERT) 25 MG tablet Take one every 6 hours for dizziness (Patient taking differently: Take 25 mg by mouth every 6 (six) hours as needed for dizziness. Take one every 6 hours for dizziness) 28 tablet 0  . SYMBICORT 80-4.5 MCG/ACT inhaler INHALE 2 PUFFS INTO THE LUNGS FIRST THING IN THE MORNING, AND THEN INHALE 2 PUFFS INTO THE LUNGS ABOUT 12 HOURS LATER 10.2 g 0  . VIBERZI 75 MG TABS TAKE 1 TABLET BY MOUTH TWICE DAILY 60 tablet 5   No current facility-administered medications on file prior to  visit.     No Known Allergies  Blood pressure 124/74, pulse 60. Standing 108/76  Uncontrolled hypertension:   Patient with previous uncontrolled hypertension, now doing well and with some hypotensive episodes.  Will have her cut back on hydralazine from 25 to 12.5 mg twice daily.  She will continue to monitor home readings and has an appointment with PA in August.  At that time if her BP is still running low, would consider discontinuing hydralazine altogether.    Phillips HayKristin Alvstad PharmD CPP [facility] [address] [place] [phone] 01/05/2017 7:56 AM

## 2017-01-05 ENCOUNTER — Ambulatory Visit (INDEPENDENT_AMBULATORY_CARE_PROVIDER_SITE_OTHER): Payer: BLUE CROSS/BLUE SHIELD | Admitting: Pharmacist Clinician (PhC)/ Clinical Pharmacy Specialist

## 2017-01-05 DIAGNOSIS — I10 Essential (primary) hypertension: Secondary | ICD-10-CM

## 2017-01-05 NOTE — Assessment & Plan Note (Signed)
Patient with previous uncontrolled hypertension, now doing well and with some hypotensive episodes.  Will have her cut back on hydralazine from 25 to 12.5 mg twice daily.  She will continue to monitor home readings and has an appointment with PA in August.  At that time if her BP is still running low, would consider discontinuing hydralazine altogether.

## 2017-01-05 NOTE — Patient Instructions (Addendum)
Return for a a follow up appointment in August with the PA  Your blood pressure today is 124/74 (goal is <130/80)  Check your blood pressure at home 3-5 days a week and keep record of the readings.  Take your BP meds as follows:  Chlorthalidone 25mg  daily  Hydralazine 12.5mg  twice daily (1/2 tablet)  Labetalol 200mg  twice daily  Losartan 100mg  daily (50mg  in AM and 50mg  in PM) (valsartan not covered on insurance)  Bring all of your meds, your BP cuff and your record of home blood pressures to your next appointment.  Exercise as you're able, try to walk approximately 30 minutes per day.  Keep salt intake to a minimum, especially watch canned and prepared boxed foods.  Eat more fresh fruits and vegetables and fewer canned items.  Avoid eating in fast food restaurants.    HOW TO TAKE YOUR BLOOD PRESSURE: . Rest 5 minutes before taking your blood pressure. .  Don't smoke or drink caffeinated beverages for at least 30 minutes before. . Take your blood pressure before (not after) you eat. . Sit comfortably with your back supported and both feet on the floor (don't cross your legs). . Elevate your arm to heart level on a table or a desk. . Use the proper sized cuff. It should fit smoothly and snugly around your bare upper arm. There should be enough room to slip a fingertip under the cuff. The bottom edge of the cuff should be 1 inch above the crease of the elbow. . Ideally, take 3 measurements at one sitting and record the average.

## 2017-01-24 ENCOUNTER — Other Ambulatory Visit: Payer: Self-pay | Admitting: Cardiovascular Disease

## 2017-03-06 NOTE — Progress Notes (Signed)
Cardiology Office Note    Date:  03/08/2017   ID:  Patricia Keith, DOB 11-28-1960, MRN [id number]  PCP:  Assunta FoundGolding, John, MD  Cardiologist: Dr. Allyson SabalBerry    Chief Complaint  Patient presents with  . Follow-up    Elevated BP    History of Present Illness:    Patricia Keith is a 56 y.o. female with past medical history of resistant HTN and COPD who presents to the office today for 5244-month follow-up.   She was examined by Dr. Allyson SabalBerry in 09/2016 and BP was significantly elevated at 218/114. Renal dopplers were performed and showed no acute abnormalities. She was continued on Labetalol, Losartan, and HCTZ at that time with Hydralazine 25mg  TID being added to her medication regimen. She was followed by the HTN Clinic and BP was actually at 112/70 during her recheck. She reported systolic readings in the 90's during the mid-day, therefore Hydralazine was decreased to 25mg  BID. HCTZ was switched to Chlorthalidone 25mg  daily. BP was stable at 124/72 during her visit in 11/2016 with Dr. Allyson SabalBerry and she was continued on her current medication regimen. At Pharmacy follow-up on 6/7, she reported episodes of hypotension and dizziness, therefore Hydralazine was further reduced to 12.5mg  BID.   In talking with the patient today, she reports overall doing well since her last office visit. She has been keeping a close check on her blood pressure and brings in a detailed log of her readings over the past several months as she checks it twice per day. At the time of her visit on 01/05/2017 her systolic's had been in the 80's to 90's. Over the past month, her systolic readings have been mostly stable in the 110's to 130's. She does have two readings that were in the 150's and she notes an associated headache with this. Over the past week, her systolics have ranged from the 120's to mid 150's as she reports being under increased stress with her job. At today's visit, her blood pressure is well controlled at 106/64.  She denies  any recent chest discomfort, palpitations, PND, edema, lightheadedness, dizziness, or presyncope. She has baseline orthopnea and sleeps with 3 pillows at night. Also has dyspnea on exertion which she accredits to her COPD and denies any acute worsening of this. She requests a refill of her Symbicort and Albuterol today as she is about to run out of the medications and does not have follow-up with her PCP until next month.   Past Medical History:  Diagnosis Date  . COPD (chronic obstructive pulmonary disease) (HCC)    DX 2015  . Hypertension   . Meniere disease 2015  . Migraines     Past Surgical History:  Procedure Laterality Date  . APPENDECTOMY    . BACTERIAL OVERGROWTH TEST N/A 01/28/2016   Procedure: BACTERIAL OVERGROWTH TEST;  Surgeon: West BaliSandi L Fields, MD;  Location: [facility];  Service: Endoscopy;  Laterality: N/A;  0700  . COLONOSCOPY N/A 08/31/2015   SLF: LYMPHOID POLYP, [place] TICS, SML IH    . TONSILLECTOMY      Current Medications: Outpatient Medications Prior to Visit  Medication Sig Dispense Refill  . aspirin 81 MG tablet Take 81 mg by mouth daily.    Marland Kitchen. b complex vitamins tablet Take 1 tablet by mouth daily.    . chlorthalidone (HYGROTON) 25 MG tablet TAKE 1 TABLET BY MOUTH DAILY 30 tablet 11  . Cholecalciferol (VITAMIN D PO) Take 1 capsule by mouth daily.    .Marland Kitchen  diazepam (VALIUM) 2 MG tablet Take 2 mg by mouth every 6 (six) hours as needed for anxiety.    Marland Kitchen escitalopram (LEXAPRO) 20 MG tablet Take 20 mg by mouth daily.    . fluticasone (FLONASE) 50 MCG/ACT nasal spray Place into both nostrils daily.    Marland Kitchen labetalol (NORMODYNE) 200 MG tablet Take 200 mg by mouth 2 (two) times daily.    . meclizine (ANTIVERT) 25 MG tablet Take one every 6 hours for dizziness (Patient taking differently: Take 25 mg by mouth every 6 (six) hours as needed for dizziness. Take one every 6 hours for dizziness) 28 tablet 0  . VIBERZI 75 MG TABS TAKE 1 TABLET BY MOUTH TWICE DAILY 60 tablet 5  .  hydrALAZINE (APRESOLINE) 25 MG tablet TAKE 1 TABLET BY MOUTH THREE TIMES DAILY 90 tablet 1  . losartan (COZAAR) 50 MG tablet Take 1 tablet (50 mg total) by mouth 2 (two) times daily. 180 tablet 1  . SYMBICORT 80-4.5 MCG/ACT inhaler INHALE 2 PUFFS INTO THE LUNGS FIRST THING IN THE MORNING, AND THEN INHALE 2 PUFFS INTO THE LUNGS ABOUT 12 HOURS LATER 10.2 g 0   No facility-administered medications prior to visit.      Allergies:   Patient has no known allergies.   Social History   Social History  . Marital status: Divorced    Spouse name: N/A  . Number of children: N/A  . Years of education: N/A   Social History Main Topics  . Smoking status: Never Smoker  . Smokeless tobacco: Never Used     Comment: Never smoked  . Alcohol use No  . Drug use: No  . Sexual activity: No   Other Topics Concern  . None   Social History Narrative  . None     Family History:  The patient's family history includes Allergies in her unknown relative; Asthma in her daughter; Breast cancer in her mother; COPD in her mother; Diabetes in her maternal grandfather; Heart disease in her father, paternal grandfather, and paternal grandmother; Hypertension in her father, mother, paternal grandfather, and paternal grandmother.   Review of Systems:   Please see the history of present illness.     General:  No chills, fever, night sweats or weight changes. Positive for increased stress and headaches.  Cardiovascular:  No chest pain, edema, palpitations, paroxysmal nocturnal dyspnea. Positive for orthopnea and dyspnea on exertion.  Dermatological: No rash, lesions/masses Respiratory: No cough, dyspnea Urologic: No hematuria, dysuria Abdominal:   No nausea, vomiting, diarrhea, bright red blood per rectum, melena, or hematemesis Neurologic:  No visual changes, wkns, changes in mental status. All other systems reviewed and are otherwise negative except as noted above.   Physical Exam:    VS:  BP 106/64   Pulse  80   Ht 5\' 6"  (1.676 m)   Wt 181 lb (82.1 kg)   BMI 29.21 kg/m    General: Well developed, well nourished Caucasian female appearing in no acute distress. Head: Normocephalic, atraumatic, sclera non-icteric, no xanthomas, nares are without discharge.  Neck: No carotid bruits. JVD not elevated.  Lungs: Respirations regular and unlabored, without wheezes or rales.  Heart: Regular rate and rhythm. No S3 or S4.  No murmur, no rubs, or gallops appreciated. Abdomen: Soft, non-tender, non-distended with normoactive bowel sounds. No hepatomegaly. No rebound/guarding. No obvious abdominal masses. Msk:  Strength and tone appear normal for age. No joint deformities or effusions. Extremities: No clubbing or cyanosis. No lower extremity edema.  Distal  pedal pulses are 2+ bilaterally. Neuro: Alert and oriented X 3. Moves all extremities spontaneously. No focal deficits noted. Psych:  Responds to questions appropriately with a normal affect. Skin: No rashes or lesions noted  Wt Readings from Last 3 Encounters:  03/08/17 181 lb (82.1 kg)  12/06/16 185 lb 3.2 oz (84 kg)  10/04/16 181 lb 6.4 oz (82.3 kg)     Studies/Labs Reviewed:   EKG:  EKG is not ordered today.   Recent Labs: 11/29/2016: BUN 18; Creat 0.88; Hemoglobin 12.1; Platelets 326; Potassium 4.1; Sodium 139   Lipid Panel No results found for: CHOL, TRIG, HDL, CHOLHDL, VLDL, LDLCALC, LDLDIRECT  Additional studies/ records that were reviewed today include:   EKG: 10/04/2016: NSR, HR 71, with no acute ST or T-wave changes.   Assessment:    1. Essential hypertension   2. Chronic obstructive pulmonary disease, unspecified COPD type (HCC)      Plan:   In order of problems listed above:  1. Essential HTN - she has a history of resistant HTN and has undergone many medication changes over the past several months. She has been on Chlorthalidone 25mg  daily, Hydralazine 12.5mg  BID, Labetalol 200mg  BID, and Losartan 50mg  BID.  - She checks  her BP twice per day and 98% of her readings have been well-controlled with SBP in the 110's to 130's with DBP in the 60's - 80's. Over the past week, her systolics have ranged from the 120's to mid-150's as she reports being under increased stress with her job. At today's visit, her blood pressure is well controlled at 106/64. - I recommended continuing on her current regimen at this time. If systolic readings sustain in the 140's, I informed her to increase her Hydralazine from 12.5mg  BID to 12.5mg  TID. She will call the office if she has any questions or concerns regarding her BP.   2. COPD - she remains on Symbicort and uses Albuterol PRN. She is about to run out of the medications and requests a refill of these medications until she can follow-up with her PCP next month. I informed her I would write for these with no refills and further refills will need to come from her PCP.   Signed, Ellsworth Lennox, PA-C  03/08/2017 8:18 PM    [facility] [address] [place], [address] [place], [place]  [address] Phone: [phone]; Fax: [phone]  [address] [place], [place] [address] Phone: [phone]

## 2017-03-08 ENCOUNTER — Encounter: Payer: Self-pay | Admitting: Student

## 2017-03-08 ENCOUNTER — Ambulatory Visit (INDEPENDENT_AMBULATORY_CARE_PROVIDER_SITE_OTHER): Payer: BLUE CROSS/BLUE SHIELD | Admitting: Student

## 2017-03-08 VITALS — BP 106/64 | HR 80 | Ht 66.0 in | Wt 181.0 lb

## 2017-03-08 DIAGNOSIS — I10 Essential (primary) hypertension: Secondary | ICD-10-CM

## 2017-03-08 DIAGNOSIS — J449 Chronic obstructive pulmonary disease, unspecified: Secondary | ICD-10-CM | POA: Diagnosis not present

## 2017-03-08 MED ORDER — LOSARTAN POTASSIUM 50 MG PO TABS: 50.0000 mg | ORAL_TABLET | Freq: Two times a day (BID) | ORAL | 3 refills | Status: DC

## 2017-03-08 MED ORDER — HYDRALAZINE HCL 25 MG PO TABS: 12.5000 mg | ORAL_TABLET | Freq: Three times a day (TID) | ORAL | 3 refills | Status: DC

## 2017-03-08 MED ORDER — ALBUTEROL SULFATE HFA 108 (90 BASE) MCG/ACT IN AERS: 2.0000 | INHALATION_SPRAY | Freq: Four times a day (QID) | RESPIRATORY_TRACT | 0 refills | Status: DC | PRN

## 2017-03-08 MED ORDER — BUDESONIDE-FORMOTEROL FUMARATE 80-4.5 MCG/ACT IN AERO: INHALATION_SPRAY | RESPIRATORY_TRACT | 0 refills | Status: DC

## 2017-03-09 NOTE — Patient Instructions (Signed)
Medication Instructions: If you systolic blood pressure is greater than 140 you may take your Hydralazine 12.5 mg three times daily instead of twice daily.   If you need a refill on your cardiac medications before your next appointment, please call your pharmacy.    Follow-Up: Your physician wants you to follow-up in: 6 months with Dr. Allyson SabalBerry. You will receive a reminder letter in the mail two months in advance. If you don't receive a letter, please call our office to schedule this follow-up appointment.    Thank you for choosing [facility]!!

## 2017-03-29 ENCOUNTER — Other Ambulatory Visit: Payer: Self-pay

## 2017-03-29 ENCOUNTER — Ambulatory Visit (INDEPENDENT_AMBULATORY_CARE_PROVIDER_SITE_OTHER): Payer: BLUE CROSS/BLUE SHIELD | Admitting: Gastroenterology

## 2017-03-29 ENCOUNTER — Encounter: Payer: Self-pay | Admitting: Gastroenterology

## 2017-03-29 VITALS — BP 147/81 | HR 79 | Temp 97.3°F | Ht 66.0 in | Wt 180.2 lb

## 2017-03-29 DIAGNOSIS — R1310 Dysphagia, unspecified: Secondary | ICD-10-CM | POA: Diagnosis not present

## 2017-03-29 DIAGNOSIS — K219 Gastro-esophageal reflux disease without esophagitis: Secondary | ICD-10-CM | POA: Diagnosis not present

## 2017-03-29 DIAGNOSIS — K580 Irritable bowel syndrome with diarrhea: Secondary | ICD-10-CM

## 2017-03-29 NOTE — Assessment & Plan Note (Signed)
DIARRHEA PREDOMINANT.SYMPTOMS FAIRLY WELL CONTROLLED.  CONTINUE LINZESS. CONTINUE TO MONITOR SYMPTOMS. FOLLOW UP IN 4 MOS.

## 2017-03-29 NOTE — Patient Instructions (Addendum)
FOLLOW A SOFT MECHANICAL DIET.   MEATS SHOULD BE CHOPPED OR GROUND ONLY. DO NOT EAT CHUNKS OF ANYTHING. SEE INFO BELOW.  AVOID REFLUX TRIGGERS. SEE INFO BELOW.  TO CONTROL HEARTBURN, ADD Prilosec 30 minutes prior to your first meal.  CONTINUE LINZESS.  COMPLETE UPPER ENDOSCOPY TO DILATE YOUR ESOPHAGUS IN 2-3 WEEKS.  FOLLOW UP IN 4 MOS.     Lifestyle and home remedies TO HELP CONTROL HEARTBURN.  You may eliminate or reduce the frequency of heartburn by making the following lifestyle changes:  . Control your weight. Being overweight is a major risk factor for heartburn and GERD. Excess pounds put pressure on your abdomen, pushing up your stomach and causing acid to back up into your esophagus.   . Eat smaller meals. 4 TO 6 MEALS A DAY. This reduces pressure on the lower esophageal sphincter, helping to prevent the valve from opening and acid from washing back into your esophagus.   Allena Earing. Loosen your belt. Clothes that fit tightly around your waist put pressure on your abdomen and the lower esophageal sphincter.  .  . Eliminate heartburn triggers. Everyone has specific triggers.Common triggers such as fatty or fried foods, spicy food, tomato sauce, carbonated beverages, alcohol, chocolate, mint, garlic, onion, caffeine and nicotine may make heartburn worse.   Marland Kitchen. Avoid stooping or bending. Tying your shoes is OK. Bending over for longer periods to weed your garden isn't, especially soon after eating.   . Don't lie down after a meal. Wait at least three to four hours after eating before going to bed, and don't lie down right after eating.   Marland Kitchen. PLACE THE HEAD OF YOUR BED ON 6 INCH BLOCKS.  Alternative medicine . Several home remedies exist for treating GERD, but they provide only temporary relief. They include drinking baking soda (sodium bicarbonate) added to water or drinking other fluids such as baking soda mixed with cream of tartar and water. . Although these liquids create temporary  relief by neutralizing, washing away or buffering acids, eventually they aggravate the situation by adding gas and fluid to your stomach, increasing pressure and causing more acid reflux. Further, adding more sodium to your diet may increase your blood pressure and add stress to your heart, and excessive bicarbonate ingestion can alter the acid-base balance in your body.  SOFT MECHANICAL DIET This SOFT MECHANICAL DIET is restricted to:  Foods that are moist, soft-textured, and easy to chew and swallow.   Meats that are ground or are minced no larger than one-quarter inch pieces. Meats are moist with gravy or sauce added.   Foods that do not include bread or bread-like textures except soft pancakes, well-moistened with syrup or sauce.   Textures with some chewing ability required.   Casseroles without rice.   Cooked vegetables that are less than half an inch in size and easily mashed with a fork. No cooked corn, peas, broccoli, cauliflower, cabbage, Brussels sprouts, asparagus, or other fibrous, non-tender or rubbery cooked vegetables.   Canned fruit except for pineapple. Fruit must be cut into pieces no larger than half an inch in size.   Foods that do not include nuts, seeds, coconut, or sticky textures.

## 2017-03-29 NOTE — Assessment & Plan Note (Signed)
NEW IN ONSET AND NOT CONTROLLED.  Use Prilosec 30 minutes prior to your first meal. AVOID TRIGGERS FOR REFLUX FOLLOW UP IN 4 MOS.

## 2017-03-29 NOTE — Assessment & Plan Note (Addendum)
SYMPTOMS NOT CONTROLLED. DIFFERENTIAL DIAGNOSIS INCLUDES: PEPTIC STRICTURE, PRIMARY ESOPHAGEAL MOTILITY DISORDER,OR 2o ESOPHAGEAL MOTILITY DISORDER DUE TO UNCONTROLLED REFLUX, LESS LIKLEY ESOPHAGEAL CANCER.  FOLLOW A SOFT MECHANICAL DIET.   MEATS SHOULD BE CHOPPED OR GROUND ONLY. DO NOT EAT CHUNKS OF ANYTHING. SEE INFO BELOW. AVOID REFLUX TRIGGERS. SEE INFO BELOW. ADD PRILOSEC 20 MINS PRIOR TO FIRST MEAL. COMPLETE UPPER ENDOSCOPY TO DILATE YOUR ESOPHAGUS IN 2-3 WEEKS. DISCUSSED PROCEDURE, BENEFITS, & RISKS: < 1% chance of medication reaction, bleeding, OR perforation. PHENERGAN 12.5 MG IV IN PREOP.  FOLLOW UP IN 4 MOS.

## 2017-03-29 NOTE — Progress Notes (Signed)
Subjective:    Patient ID: Patricia Keith, female    DOB: 1961/03/28, 56 y.o.   MRN: [id number]  Assunta Found, MD  HPI WHEN EATS HIGH FAT FOOD AND UPSET WITH MAYONAISSE SHE GETS RUQ DISCOMFORT AND TENDER SPOT AT YOUR NAVEL. TROUBLE SWALLOWING: MAINLY CHICKEN, BREAD, AND MAYBE WATER(BRED, CHICKEN, POTATOES,-ALMOST EVERY DAY). HEARTBURN: HERE LATELY EVERY DAY. BOWELS GOOD OTHER THAN WHEN EATS MAYO HEAVY THINGS(DIARRHEA). ASA DAILY. NO BC/GOODY POWDERS, IBUPROFEN/MOTRIN, OR NAPROXEN/ALEVE. TAKES TYLENOL. TAKES VIBERZI ONCE A DAY.   PT DENIES FEVER, CHILLS, HEMATOCHEZIA, HEMATEMESIS, nausea, vomiting, melena, diarrhea, CHEST PAIN, SHORTNESS OF BREATH,  CHANGE IN BOWEL IN HABITS, OR constipation.   Past Medical History:  Diagnosis Date  . COPD (chronic obstructive pulmonary disease) (HCC)    DX 2015  . Hypertension   . Meniere disease 2015  . Migraines    Past Surgical History:  Procedure Laterality Date  . APPENDECTOMY    . BACTERIAL OVERGROWTH TEST N/A 01/28/2016     . COLONOSCOPY N/A 08/31/2015   SLF: LYMPHOID POLYP, [place] TICS, SML IH    . TONSILLECTOMY     No Known Allergies  Current Outpatient Prescriptions  Medication Sig Dispense Refill  . albuterol (PROVENTIL HFA;VENTOLIN HFA) 108 (90 Base) MCG/ACT inhaler Inhale 2 puffs into the lungs every 6 (six) hours as needed for wheezing or shortness of breath.    Marland Kitchen aspirin 81 MG tablet Take 81 mg by mouth daily.    Marland Kitchen b complex vitamins tablet Take 1 tablet by mouth daily.    . budesonide-formoterol (SYMBICORT) 80-4.5 MCG/ACT inhaler INHALE 2 PUFFS INTO THE LUNGS FIRST THING IN THE MORNING, AND THEN INHALE 2 PUFFS INTO THE LUNGS ABOUT 12 HOURS LATER    . chlorthalidone (HYGROTON) 25 MG tablet TAKE 1 TABLET BY MOUTH DAILY    . Cholecalciferol (VITAMIN D PO) Take 1 capsule by mouth daily.    . diazepam (VALIUM) 2 MG tablet Take 2 mg by mouth every 6 (six) hours as needed for anxiety.    Marland Kitchen escitalopram (LEXAPRO) 20 MG tablet Take 20 mg by  mouth daily.    . fluticasone (FLONASE) 50 MCG/ACT nasal spray Place into both nostrils daily.    . hydrALAZINE (APRESOLINE) 25 MG tablet Take 0.5 tablets (12.5 mg total) by mouth 3 (three) times daily.    Marland Kitchen labetalol (NORMODYNE) 200 MG tablet Take 200 mg by mouth 2 (two) times daily.    Marland Kitchen losartan (COZAAR) 50 MG tablet Take 1 tablet (50 mg total) by mouth 2 (two) times daily.    . meclizine (ANTIVERT) 25 MG tablet Take one every 6 hours for dizziness (Patient taking differently: Take 25 mg by mouth every 6 (six) hours as needed for dizziness. Take one every 6 hours for dizziness)    . VIBERZI 75 MG TABS TAKE 1 TABLET BY MOUTH TWICE DAILY (Patient taking differently: TAKE 1 TABLET BY MOUTH TWICE DAILY. takes once daily)      Review of Systems PER HPI OTHERWISE ALL SYSTEMS ARE NEGATIVE.    Objective:   Physical Exam  Constitutional: She is oriented to person, place, and time. She appears well-developed and well-nourished. No distress.  HENT:  Head: Normocephalic and atraumatic.  Mouth/Throat: Oropharynx is clear and moist. No oropharyngeal exudate.  Eyes: Pupils are equal, round, and reactive to light. No scleral icterus.  Neck: Normal range of motion. Neck supple.  Cardiovascular: Normal rate, regular rhythm and normal heart sounds.   Pulmonary/Chest: Effort normal and breath sounds  normal. No respiratory distress.  Abdominal: Soft. Bowel sounds are normal. She exhibits no distension. There is no tenderness.  Musculoskeletal: She exhibits no edema.  Lymphadenopathy:    She has no cervical adenopathy.  Neurological: She is alert and oriented to person, place, and time.  Psychiatric: She has a normal mood and affect.  Vitals reviewed.         Assessment & Plan:   

## 2017-03-30 NOTE — Progress Notes (Signed)
ON RECALL  °

## 2017-03-30 NOTE — Progress Notes (Signed)
cc'ed to pcp °

## 2017-03-31 ENCOUNTER — Other Ambulatory Visit: Payer: Self-pay

## 2017-04-17 ENCOUNTER — Telehealth: Payer: Self-pay

## 2017-04-17 NOTE — Telephone Encounter (Signed)
Called pt to see if she wanted to have EGD done earlier in the morning d/t a cancellation. She wanted to keep same procedure time d/t she has to work in the morning. Called and informed Endo scheduler.

## 2017-04-18 ENCOUNTER — Encounter (HOSPITAL_COMMUNITY): Payer: Self-pay | Admitting: *Deleted

## 2017-04-18 ENCOUNTER — Encounter (HOSPITAL_COMMUNITY)
Admission: RE | Disposition: A | Discharge status: Home / Self Care | Payer: Self-pay | Source: Ambulatory Visit | Attending: Gastroenterology

## 2017-04-18 ENCOUNTER — Ambulatory Visit (HOSPITAL_COMMUNITY)
Admission: RE | Admit: 2017-04-18 | Discharge: 2017-04-18 | Disposition: A | Discharge status: Home / Self Care | Payer: BLUE CROSS/BLUE SHIELD | Source: Ambulatory Visit | Attending: Gastroenterology | Admitting: Gastroenterology

## 2017-04-18 DIAGNOSIS — K2950 Unspecified chronic gastritis without bleeding: Secondary | ICD-10-CM | POA: Insufficient documentation

## 2017-04-18 DIAGNOSIS — Z7982 Long term (current) use of aspirin: Secondary | ICD-10-CM | POA: Diagnosis not present

## 2017-04-18 DIAGNOSIS — R1310 Dysphagia, unspecified: Secondary | ICD-10-CM

## 2017-04-18 DIAGNOSIS — G43909 Migraine, unspecified, not intractable, without status migrainosus: Secondary | ICD-10-CM | POA: Insufficient documentation

## 2017-04-18 DIAGNOSIS — K2970 Gastritis, unspecified, without bleeding: Secondary | ICD-10-CM | POA: Diagnosis not present

## 2017-04-18 DIAGNOSIS — Z79899 Other long term (current) drug therapy: Secondary | ICD-10-CM | POA: Insufficient documentation

## 2017-04-18 DIAGNOSIS — J449 Chronic obstructive pulmonary disease, unspecified: Secondary | ICD-10-CM | POA: Insufficient documentation

## 2017-04-18 DIAGNOSIS — K2980 Duodenitis without bleeding: Secondary | ICD-10-CM | POA: Diagnosis not present

## 2017-04-18 DIAGNOSIS — K222 Esophageal obstruction: Secondary | ICD-10-CM | POA: Diagnosis not present

## 2017-04-18 DIAGNOSIS — K2960 Other gastritis without bleeding: Secondary | ICD-10-CM

## 2017-04-18 DIAGNOSIS — H8109 Meniere's disease, unspecified ear: Secondary | ICD-10-CM | POA: Diagnosis not present

## 2017-04-18 DIAGNOSIS — I10 Essential (primary) hypertension: Secondary | ICD-10-CM | POA: Insufficient documentation

## 2017-04-18 DIAGNOSIS — K259 Gastric ulcer, unspecified as acute or chronic, without hemorrhage or perforation: Secondary | ICD-10-CM | POA: Diagnosis not present

## 2017-04-18 HISTORY — PX: SAVORY DILATION: SHX5439

## 2017-04-18 HISTORY — PX: ESOPHAGOGASTRODUODENOSCOPY: SHX5428

## 2017-04-18 SURGERY — EGD (ESOPHAGOGASTRODUODENOSCOPY)
Anesthesia: Moderate Sedation

## 2017-04-18 MED ORDER — LIDOCAINE VISCOUS 2 % MT SOLN
OROMUCOSAL | Status: DC | PRN
  Administered 2017-04-18: 1 via OROMUCOSAL

## 2017-04-18 MED ORDER — LIDOCAINE VISCOUS 2 % MT SOLN
OROMUCOSAL | Status: AC
  Filled 2017-04-18: qty 15

## 2017-04-18 MED ORDER — MIDAZOLAM HCL 5 MG/5ML IJ SOLN
INTRAMUSCULAR | Status: AC
  Filled 2017-04-18: qty 10

## 2017-04-18 MED ORDER — SODIUM CHLORIDE 0.9% FLUSH
INTRAVENOUS | Status: AC
  Filled 2017-04-18: qty 10

## 2017-04-18 MED ORDER — MIDAZOLAM HCL 5 MG/5ML IJ SOLN
INTRAMUSCULAR | Status: DC | PRN
  Administered 2017-04-18: 2 mg via INTRAVENOUS
  Administered 2017-04-18: 1 mg via INTRAVENOUS
  Administered 2017-04-18: 2 mg via INTRAVENOUS

## 2017-04-18 MED ORDER — MEPERIDINE HCL 100 MG/ML IJ SOLN
INTRAMUSCULAR | Status: AC
  Filled 2017-04-18: qty 2

## 2017-04-18 MED ORDER — PANTOPRAZOLE SODIUM 40 MG PO TBEC: DELAYED_RELEASE_TABLET | ORAL | 11 refills | Status: DC

## 2017-04-18 MED ORDER — PROMETHAZINE HCL 25 MG/ML IJ SOLN
12.5000 mg | Freq: Once | INTRAMUSCULAR | Status: AC
  Administered 2017-04-18: 12.5 mg via INTRAVENOUS

## 2017-04-18 MED ORDER — MINERAL OIL PO OIL
TOPICAL_OIL | ORAL | Status: AC
  Filled 2017-04-18: qty 30

## 2017-04-18 MED ORDER — SODIUM CHLORIDE 0.9 % IV SOLN
INTRAVENOUS | Status: DC
  Administered 2017-04-18: 13:00:00 via INTRAVENOUS

## 2017-04-18 MED ORDER — MEPERIDINE HCL 100 MG/ML IJ SOLN
INTRAMUSCULAR | Status: DC | PRN
  Administered 2017-04-18: 50 mg via INTRAVENOUS
  Administered 2017-04-18 (×2): 25 mg via INTRAVENOUS

## 2017-04-18 MED ORDER — PROMETHAZINE HCL 25 MG/ML IJ SOLN
INTRAMUSCULAR | Status: AC
  Filled 2017-04-18: qty 1

## 2017-04-18 NOTE — Op Note (Signed)
[facility] Patient Name: Patricia Keith Procedure Date: 04/18/2017 1:14 PM MRN: [id number] Date of Birth: 23-Apr-1961 Attending MD: Jonette Eva MD, MD CSN: [id number] Age: 56 Admit Type: Outpatient Procedure:                Upper GI endoscopy WITH ESOPHAGEAL DILATION AND                            COLD FORCEPS BIOPSY Indications:              Dysphagia Providers:                Jonette Eva MD, MD, Nena Polio, RN, Dyann Ruddle Referring MD:             Corrie Mckusick MD, MD Medicines:                Promethazine 12.5 mg IV, Meperidine 100 mg IV,                            Midazolam 5 mg IV Complications:            No immediate complications. Estimated Blood Loss:     Estimated blood loss was minimal. Procedure:                Pre-Anesthesia Assessment:                           - Prior to the procedure, a History and Physical                            was performed, and patient medications and                            allergies were reviewed. The patient's tolerance of                            previous anesthesia was also reviewed. The risks                            and benefits of the procedure and the sedation                            options and risks were discussed with the patient.                            All questions were answered, and informed consent                            was obtained. Prior Anticoagulants: The patient has                            taken aspirin, last dose was 1 day prior to                            procedure. ASA Grade Assessment: II - A patient  with mild systemic disease. After reviewing the                            risks and benefits, the patient was deemed in                            satisfactory condition to undergo the procedure.                            After obtaining informed consent, the endoscope was                            passed under direct vision. Throughout the       procedure, the patient's blood pressure, pulse, and                            oxygen saturations were monitored continuously. The                            EG-[id number] ([id number]) scope was introduced through the                            mouth, and advanced to the second part of duodenum.                            The patient tolerated the procedure well. The upper                            GI endoscopy was accomplished without difficulty. Scope In: 1:59:42 PM Scope Out: 2:10:20 PM Total Procedure Duration: 0 hours 10 minutes 38 seconds  Findings:      One mild (non-circumferential scarring) benign-appearing, intrinsic       stenosis was found. And was traversed. A guidewire was placed and the       scope was withdrawn. Dilation was performed with a Savary dilator with       mild resistance at 15 mm, 16 mm and 17 mm. Estimated blood loss was       minimal.      Diffuse moderate inflammation characterized by congestion (edema),       erosions, erythema and linear erosions was found in the gastric body and       in the gastric antrum. Biopsies were taken with a cold forceps for       Helicobacter pylori testing.      Patchy mild inflammation characterized by congestion (edema) and       erythema was found in the duodenal bulb.      The second portion of the duodenum was normal. Impression:               - Benign-appearing esophageal stenosis. Dilated.                           - MODERATE Gastritis AND MILD Duodenitis. Moderate Sedation:      Moderate (conscious) sedation was administered by the endoscopy nurse       and supervised by the endoscopist. The following parameters were  monitored: oxygen saturation, heart rate, blood pressure, and response       to care. Total physician intraservice time was 26 minutes. Recommendation:           - Await pathology results.                           - Resume previous diet.                           - Continue present medications.                            - Use Protonix (pantoprazole) 40 mg PO BID for 3                            months THEN ONCE DAILY FOREVER.                           - Return to my office in 4 months.                           - Patient has a contact number available for                            emergencies. The signs and symptoms of potential                            delayed complications were discussed with the                            patient. Return to normal activities tomorrow.                            Written discharge instructions were provided to the                            patient. Procedure Code(s):        --- Professional ---                           [phone], Esophagogastroduodenoscopy, flexible,                            transoral; with insertion of guide wire followed by                            passage of dilator(s) through esophagus over guide                            wire                           43239, Esophagogastroduodenoscopy, flexible,                            transoral; with biopsy, single or multiple  16109, Moderate sedation services provided by the                            same physician or other qualified health care                            professional performing the diagnostic or                            therapeutic service that the sedation supports,                            requiring the presence of an independent trained                            observer to assist in the monitoring of the                            patient's level of consciousness and physiological                            status; initial 15 minutes of intraservice time,                            patient age 50 years or older                           [phone], Moderate sedation services; each additional                            15 minutes intraservice time Diagnosis Code(s):        --- Professional ---                           K22.2, Esophageal  obstruction                           K29.70, Gastritis, unspecified, without bleeding                           K29.80, Duodenitis without bleeding                           R13.10, Dysphagia, unspecified CPT copyright 2016 [employer]. All rights reserved. The codes documented in this report are preliminary and upon coder review may  be revised to meet current compliance requirements. Jonette Eva, MD Jonette Eva MD, MD 04/18/2017 2:23:05 PM This report has been signed electronically. Number of Addenda: 0

## 2017-04-18 NOTE — H&P (View-Only) (Signed)
Subjective:    Patient ID: Patricia Keith, female    DOB: 1961/03/28, 56 y.o.   MRN: [id number]  Assunta Found, MD  HPI WHEN EATS HIGH FAT FOOD AND UPSET WITH MAYONAISSE SHE GETS RUQ DISCOMFORT AND TENDER SPOT AT YOUR NAVEL. TROUBLE SWALLOWING: MAINLY CHICKEN, BREAD, AND MAYBE WATER(BRED, CHICKEN, POTATOES,-ALMOST EVERY DAY). HEARTBURN: HERE LATELY EVERY DAY. BOWELS GOOD OTHER THAN WHEN EATS MAYO HEAVY THINGS(DIARRHEA). ASA DAILY. NO BC/GOODY POWDERS, IBUPROFEN/MOTRIN, OR NAPROXEN/ALEVE. TAKES TYLENOL. TAKES VIBERZI ONCE A DAY.   PT DENIES FEVER, CHILLS, HEMATOCHEZIA, HEMATEMESIS, nausea, vomiting, melena, diarrhea, CHEST PAIN, SHORTNESS OF BREATH,  CHANGE IN BOWEL IN HABITS, OR constipation.   Past Medical History:  Diagnosis Date  . COPD (chronic obstructive pulmonary disease) (HCC)    DX 2015  . Hypertension   . Meniere disease 2015  . Migraines    Past Surgical History:  Procedure Laterality Date  . APPENDECTOMY    . BACTERIAL OVERGROWTH TEST N/A 01/28/2016     . COLONOSCOPY N/A 08/31/2015   SLF: LYMPHOID POLYP, [place] TICS, SML IH    . TONSILLECTOMY     No Known Allergies  Current Outpatient Prescriptions  Medication Sig Dispense Refill  . albuterol (PROVENTIL HFA;VENTOLIN HFA) 108 (90 Base) MCG/ACT inhaler Inhale 2 puffs into the lungs every 6 (six) hours as needed for wheezing or shortness of breath.    Marland Kitchen aspirin 81 MG tablet Take 81 mg by mouth daily.    Marland Kitchen b complex vitamins tablet Take 1 tablet by mouth daily.    . budesonide-formoterol (SYMBICORT) 80-4.5 MCG/ACT inhaler INHALE 2 PUFFS INTO THE LUNGS FIRST THING IN THE MORNING, AND THEN INHALE 2 PUFFS INTO THE LUNGS ABOUT 12 HOURS LATER    . chlorthalidone (HYGROTON) 25 MG tablet TAKE 1 TABLET BY MOUTH DAILY    . Cholecalciferol (VITAMIN D PO) Take 1 capsule by mouth daily.    . diazepam (VALIUM) 2 MG tablet Take 2 mg by mouth every 6 (six) hours as needed for anxiety.    Marland Kitchen escitalopram (LEXAPRO) 20 MG tablet Take 20 mg by  mouth daily.    . fluticasone (FLONASE) 50 MCG/ACT nasal spray Place into both nostrils daily.    . hydrALAZINE (APRESOLINE) 25 MG tablet Take 0.5 tablets (12.5 mg total) by mouth 3 (three) times daily.    Marland Kitchen labetalol (NORMODYNE) 200 MG tablet Take 200 mg by mouth 2 (two) times daily.    Marland Kitchen losartan (COZAAR) 50 MG tablet Take 1 tablet (50 mg total) by mouth 2 (two) times daily.    . meclizine (ANTIVERT) 25 MG tablet Take one every 6 hours for dizziness (Patient taking differently: Take 25 mg by mouth every 6 (six) hours as needed for dizziness. Take one every 6 hours for dizziness)    . VIBERZI 75 MG TABS TAKE 1 TABLET BY MOUTH TWICE DAILY (Patient taking differently: TAKE 1 TABLET BY MOUTH TWICE DAILY. takes once daily)      Review of Systems PER HPI OTHERWISE ALL SYSTEMS ARE NEGATIVE.    Objective:   Physical Exam  Constitutional: She is oriented to person, place, and time. She appears well-developed and well-nourished. No distress.  HENT:  Head: Normocephalic and atraumatic.  Mouth/Throat: Oropharynx is clear and moist. No oropharyngeal exudate.  Eyes: Pupils are equal, round, and reactive to light. No scleral icterus.  Neck: Normal range of motion. Neck supple.  Cardiovascular: Normal rate, regular rhythm and normal heart sounds.   Pulmonary/Chest: Effort normal and breath sounds  normal. No respiratory distress.  Abdominal: Soft. Bowel sounds are normal. She exhibits no distension. There is no tenderness.  Musculoskeletal: She exhibits no edema.  Lymphadenopathy:    She has no cervical adenopathy.  Neurological: She is alert and oriented to person, place, and time.  Psychiatric: She has a normal mood and affect.  Vitals reviewed.         Assessment & Plan:

## 2017-04-18 NOTE — Interval H&P Note (Signed)
History and Physical Interval Note:  04/18/2017 1:40 PM  Patricia Keith  has presented today for surgery, with the diagnosis of DYSPHAGIA  The various methods of treatment have been discussed with the patient and family. After consideration of risks, benefits and other options for treatment, the patient has consented to  Procedure(s) with comments: ESOPHAGOGASTRODUODENOSCOPY (EGD) (N/A) - 145 - pt can not move SAVORY DILATION (N/A) as a surgical intervention .  The patient's history has been reviewed, patient examined, no change in status, stable for surgery.  I have reviewed the patient's chart and labs.  Questions were answered to the patient's satisfaction.     [employer]

## 2017-04-18 NOTE — Discharge Instructions (Signed)
I dilated your esophagus. You have a stricture near the base of your esophagus.  You have erosive gastritis DUE TO ASPIRIN USE. IT CAN LEAD TO ULCERS. I biopsied your stomach.   DRINK WATER TO KEEP YOUR URINE LIGHT YELLOW.  AVOID TRIGGERS FOR REFLUX AND GASTRITIS. SEE INFO BELOW.  TO PREVENT STRICTURES, ULCERS AND GASTRITIS, START PROTONIX. TAKE 30 MINUTES PRIOR TO MEALS TWICE DAILY FOR 3 MOS THEN ONCE DAILY FOREVER.  YOUR BIOPSY RESULTS WILL BE AVAILABLE IN MY CHART AFTER  SEP 21 AND MY OFFICE WILL CONTACT YOU IN 10-14 DAYS WITH YOUR RESULTS.   FOLLOW UP IN 4 MOS.  UPPER ENDOSCOPY AFTER CARE Read the instructions outlined below and refer to this sheet in the next week. These discharge instructions provide you with general information on caring for yourself after you leave the hospital. While your treatment has been planned according to the most current medical practices available, unavoidable complications occasionally occur. If you have any problems or questions after discharge, call DR. Abednego Yeates, [phone].  ACTIVITY  You may resume your regular activity, but move at a slower pace for the next 24 hours.   Take frequent rest periods for the next 24 hours.   Walking will help get rid of the air and reduce the bloated feeling in your belly (abdomen).   No driving for 24 hours (because of the medicine (anesthesia) used during the test).   You may shower.   Do not sign any important legal documents or operate any machinery for 24 hours (because of the anesthesia used during the test).    NUTRITION  Drink plenty of fluids.   You may resume your normal diet as instructed by your doctor.   Begin with a light meal and progress to your normal diet. Heavy or fried foods are harder to digest and may make you feel sick to your stomach (nauseated).   Avoid alcoholic beverages for 24 hours or as instructed.    MEDICATIONS  You may resume your normal medications.   WHAT YOU CAN  EXPECT TODAY  Some feelings of bloating in the abdomen.   Passage of more gas than usual.    IF YOU HAD A BIOPSY TAKEN DURING THE UPPER ENDOSCOPY:  Eat a soft diet IF YOU HAVE NAUSEA, BLOATING, ABDOMINAL PAIN, OR VOMITING.    FINDING OUT THE RESULTS OF YOUR TEST Not all test results are available during your visit. DR. Darrick Penna WILL CALL YOU WITHIN 14 DAYS OF YOUR PROCEDUE WITH YOUR RESULTS. Do not assume everything is normal if you have not heard from DR. Janei Scheff, CALL HER OFFICE AT [phone].  SEEK IMMEDIATE MEDICAL ATTENTION AND CALL THE OFFICE: [phone] IF:  You have more than a spotting of blood in your stool.   Your belly is swollen (abdominal distention).   You are nauseated or vomiting.   You have a temperature over 101F.   You have abdominal pain or discomfort that is severe or gets worse throughout the day.  Gastritis  Gastritis is an inflammation (the body's way of reacting to injury and/or infection) of the stomach. It is often caused by viral or bacterial (germ) infections. It can also be caused BY ASPIRIN, BC/GOODY POWDER'S, (IBUPROFEN) MOTRIN, OR ALEVE (NAPROXEN), chemicals (including alcohol), SPICY FOODS, and medications. This illness may be associated with generalized malaise (feeling tired, not well), UPPER ABDOMINAL STOMACH cramps, and fever. One common bacterial cause of gastritis is an organism known as H. Pylori. This can be treated with antibiotics.  ESOPHAGEAL STRICTURE  Esophageal strictures can be caused by stomach acid backing up into the tube that carries food from the mouth down to the stomach (lower esophagus).  TREATMENT There are a number of medicines used to treat reflux/stricture, including: Antacids.  Proton-pump inhibitors:PROTONIX ZANTAC OR PEPCID  HOME CARE INSTRUCTIONS Eat 2-3 hours before going to bed.  Try to reach and maintain a healthy weight.  Do not eat just a few very large meals. Instead, eat 4 TO 6 smaller meals  throughout the day.  Try to identify foods and beverages that make your symptoms worse, and avoid these.  Avoid tight clothing.  Do not exercise right after eating.   Lifestyle and home remedies TO PREVENT REFLUX/HEARTBURN  You may eliminate or reduce the frequency of heartburn by making the following lifestyle changes:   Control your weight. Being overweight is a major risk factor for heartburn and GERD. Excess pounds put pressure on your abdomen, pushing up your stomach and causing acid to back up into your esophagus.    Eat smaller meals. 4 TO 6 MEALS A DAY. This reduces pressure on the lower esophageal sphincter, helping to prevent the valve from opening and acid from washing back into your esophagus.    Loosen your belt. Clothes that fit tightly around your waist put pressure on your abdomen and the lower esophageal sphincter.    Eliminate heartburn triggers. Everyone has specific triggers. Common triggers such as fatty or fried foods, spicy food, tomato sauce, carbonated beverages, alcohol, chocolate, mint, garlic, onion, caffeine and nicotine may make heartburn worse.    Avoid stooping or bending. Tying your shoes is OK. Bending over for longer periods to weed your garden isn't, especially soon after eating.    Don't lie down after a meal. Wait at least three to four hours after eating before going to bed, and don't lie down right after eating.   Alternative medicine  Several home remedies exist for treating GERD, but they provide only temporary relief. They include drinking baking soda (sodium bicarbonate) added to water or drinking other fluids such as baking soda mixed with cream of tartar and water.  Although these liquids create temporary relief by neutralizing, washing away or buffering acids, eventually they aggravate the situation by adding gas and fluid to your stomach, increasing pressure and causing more acid reflux. Further, adding more sodium to your diet may  increase your blood pressure and add stress to your heart, and excessive bicarbonate ingestion can alter the acid-base balance in your body.

## 2017-04-20 DIAGNOSIS — Z1389 Encounter for screening for other disorder: Secondary | ICD-10-CM | POA: Diagnosis not present

## 2017-04-20 DIAGNOSIS — I10 Essential (primary) hypertension: Secondary | ICD-10-CM | POA: Diagnosis not present

## 2017-04-20 DIAGNOSIS — K219 Gastro-esophageal reflux disease without esophagitis: Secondary | ICD-10-CM | POA: Diagnosis not present

## 2017-04-20 DIAGNOSIS — E663 Overweight: Secondary | ICD-10-CM | POA: Diagnosis not present

## 2017-04-20 DIAGNOSIS — Z6829 Body mass index (BMI) 29.0-29.9, adult: Secondary | ICD-10-CM | POA: Diagnosis not present

## 2017-04-20 DIAGNOSIS — Z0000 Encounter for general adult medical examination without abnormal findings: Secondary | ICD-10-CM | POA: Diagnosis not present

## 2017-04-20 DIAGNOSIS — Z23 Encounter for immunization: Secondary | ICD-10-CM | POA: Diagnosis not present

## 2017-04-20 DIAGNOSIS — J45909 Unspecified asthma, uncomplicated: Secondary | ICD-10-CM | POA: Diagnosis not present

## 2017-04-21 ENCOUNTER — Encounter (HOSPITAL_COMMUNITY): Payer: Self-pay | Admitting: Gastroenterology

## 2017-04-24 NOTE — Progress Notes (Signed)
ON RECALL  °

## 2017-04-25 NOTE — Progress Notes (Signed)
PT is aware.

## 2017-05-30 DIAGNOSIS — D225 Melanocytic nevi of trunk: Secondary | ICD-10-CM | POA: Diagnosis not present

## 2017-05-30 DIAGNOSIS — L821 Other seborrheic keratosis: Secondary | ICD-10-CM | POA: Diagnosis not present

## 2017-05-30 DIAGNOSIS — L853 Xerosis cutis: Secondary | ICD-10-CM | POA: Diagnosis not present

## 2017-06-12 ENCOUNTER — Encounter: Payer: Self-pay | Admitting: Gastroenterology

## 2017-09-07 ENCOUNTER — Other Ambulatory Visit (HOSPITAL_COMMUNITY): Payer: Self-pay | Admitting: Internal Medicine

## 2017-09-07 DIAGNOSIS — Z1231 Encounter for screening mammogram for malignant neoplasm of breast: Secondary | ICD-10-CM

## 2017-09-15 ENCOUNTER — Ambulatory Visit (HOSPITAL_COMMUNITY)
Admission: RE | Admit: 2017-09-15 | Discharge: 2017-09-15 | Disposition: A | Discharge status: Home / Self Care | Payer: BLUE CROSS/BLUE SHIELD | Source: Ambulatory Visit | Attending: Internal Medicine | Admitting: Internal Medicine

## 2017-09-15 DIAGNOSIS — Z1231 Encounter for screening mammogram for malignant neoplasm of breast: Secondary | ICD-10-CM | POA: Diagnosis not present

## 2017-12-14 ENCOUNTER — Other Ambulatory Visit: Payer: Self-pay | Admitting: Cardiovascular Disease

## 2018-02-13 ENCOUNTER — Other Ambulatory Visit: Payer: Self-pay | Admitting: Student

## 2018-02-13 NOTE — Telephone Encounter (Signed)
Rx request sent to pharmacy.  

## 2018-03-15 ENCOUNTER — Other Ambulatory Visit: Payer: Self-pay | Admitting: Student

## 2018-03-15 ENCOUNTER — Other Ambulatory Visit: Payer: Self-pay | Admitting: Gastroenterology

## 2018-04-17 ENCOUNTER — Other Ambulatory Visit: Payer: Self-pay | Admitting: Cardiovascular Disease

## 2018-05-16 ENCOUNTER — Other Ambulatory Visit: Payer: Self-pay | Admitting: Cardiovascular Disease

## 2018-05-16 NOTE — Telephone Encounter (Signed)
Rx request sent to pharmacy.  

## 2018-06-14 ENCOUNTER — Other Ambulatory Visit: Payer: Self-pay | Admitting: Cardiovascular Disease

## 2018-06-29 ENCOUNTER — Other Ambulatory Visit: Payer: Self-pay | Admitting: Student

## 2018-07-02 DIAGNOSIS — M5416 Radiculopathy, lumbar region: Secondary | ICD-10-CM | POA: Diagnosis not present

## 2018-07-02 DIAGNOSIS — Z0001 Encounter for general adult medical examination with abnormal findings: Secondary | ICD-10-CM | POA: Diagnosis not present

## 2018-07-02 DIAGNOSIS — Z1389 Encounter for screening for other disorder: Secondary | ICD-10-CM | POA: Diagnosis not present

## 2018-07-02 DIAGNOSIS — Z0000 Encounter for general adult medical examination without abnormal findings: Secondary | ICD-10-CM | POA: Diagnosis not present

## 2018-07-02 DIAGNOSIS — E6609 Other obesity due to excess calories: Secondary | ICD-10-CM | POA: Diagnosis not present

## 2018-07-02 DIAGNOSIS — Z6830 Body mass index (BMI) 30.0-30.9, adult: Secondary | ICD-10-CM | POA: Diagnosis not present

## 2018-07-03 NOTE — Telephone Encounter (Signed)
Rx(s) sent to pharmacy electronically.  

## 2018-08-15 ENCOUNTER — Other Ambulatory Visit: Payer: Self-pay | Admitting: Cardiovascular Disease

## 2018-08-30 ENCOUNTER — Encounter: Payer: Self-pay | Admitting: *Deleted

## 2018-08-30 NOTE — Progress Notes (Signed)
Cardiology Office Note  Date: 08/31/2018   ID: SABREE EVE, DOB 06-16-1961, MRN [id number]  PCP: Assunta Found, MD  Evaluating Cardiologist: Nona Dell, MD   Chief Complaint  Patient presents with  . Cardiac follow-up    History of Present Illness: Patricia Keith is a 58 y.o. female that I am meeting for the first time in clinic today.  She is a patient of Dr. Allyson Sabal, last seen in the office by Ms. Strader PA-C in August 2018.  Based on review of the records, she has been followed for management of hypertension.  She presents today for a routine visit.  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  States that she is busy with work, also takes care of her elderly mother with whom she lives.  She does not report any chest pain or palpitations.  Has been under a lot of stress.  We went over her medications which are listed below.  Reports compliance with her antihypertensive regimen.  She also follows with Dr. Phillips Odor.  I personally reviewed her ECG today which shows normal sinus rhythm.  Past Medical History:  Diagnosis Date  . COPD (chronic obstructive pulmonary disease) (HCC)   . Hypertension   . Meniere disease 2015  . Migraines     Past Surgical History:  Procedure Laterality Date  . APPENDECTOMY    . BACTERIAL OVERGROWTH TEST N/A 01/28/2016   Procedure: BACTERIAL OVERGROWTH TEST;  Surgeon: West Bali, MD;  Location: [facility];  Service: Endoscopy;  Laterality: N/A;  0700  . COLONOSCOPY N/A 08/31/2015   SLF: LYMPHOID POLYP, Barry TICS, SML IH    . ESOPHAGOGASTRODUODENOSCOPY N/A 04/18/2017   Procedure: ESOPHAGOGASTRODUODENOSCOPY (EGD);  Surgeon: West Bali, MD;  Location: [facility];  Service: Endoscopy;  Laterality: N/A;  145 - pt can not move  . SAVORY DILATION N/A 04/18/2017   Procedure: SAVORY DILATION;  Surgeon: West Bali, MD;  Location: [facility];  Service: Endoscopy;  Laterality: N/A;  . TONSILLECTOMY      Current  Outpatient Medications  Medication Sig Dispense Refill  . albuterol (PROVENTIL HFA;VENTOLIN HFA) 108 (90 Base) MCG/ACT inhaler Inhale 2 puffs into the lungs every 6 (six) hours as needed for wheezing or shortness of breath. 1 Inhaler 0  . aspirin 81 MG tablet Take 81 mg by mouth daily.    Marland Kitchen atorvastatin (LIPITOR) 20 MG tablet Take 20 mg by mouth daily.    Marland Kitchen b complex vitamins tablet Take 1 tablet by mouth daily.    . calcium carbonate (TUMS - DOSED IN MG ELEMENTAL CALCIUM) 500 MG chewable tablet Chew 2 tablets by mouth 2 (two) times daily as needed for indigestion or heartburn.    . chlorthalidone (HYGROTON) 25 MG tablet TAKE 1 TABLET BY MOUTH EVERY DAY 30 tablet 6  . Cholecalciferol (VITAMIN D PO) Take 1 capsule by mouth daily.    . fluticasone (FLONASE) 50 MCG/ACT nasal spray Place 2 sprays into both nostrils daily.     . hydrALAZINE (APRESOLINE) 25 MG tablet TAKE 1/2 TABLET BY MOUTH THREE TIMES DAILY 180 tablet 3  . labetalol (NORMODYNE) 200 MG tablet Take 200 mg by mouth 2 (two) times daily.    Marland Kitchen losartan (COZAAR) 100 MG tablet Take 0.5 tablets (50 mg total) by mouth 2 (two) times daily. 30 tablet 1  . meclizine (ANTIVERT) 25 MG tablet Take one every 6 hours for dizziness (Patient taking differently: Take 25 mg by  mouth every 6 (six) hours as needed for dizziness. Take one every 6 hours for dizziness) 28 tablet 0  . pantoprazole (PROTONIX) 40 MG tablet TAKE ONE TABLET BY MOUTH 30 MINUTES PRIOR TO MEALS TWICE DAILY FOR THREE MONTHS THEN TAKE 1 TABLET EVERY DAY 60 tablet 11   No current facility-administered medications for this visit.    Allergies:  Patient has no known allergies.   Social History: The patient  reports that she has never smoked. She has never used smokeless tobacco. She reports that she does not drink alcohol or use drugs.   Family History: The patient's family history includes Allergies in an other family member; Asthma in her daughter; Breast cancer in her mother; COPD in  her mother; Diabetes in her maternal grandfather; Heart disease in her father, paternal grandfather, and paternal grandmother; Hypertension in her father, mother, paternal grandfather, and paternal grandmother.   ROS:  Please see the history of present illness. Otherwise, complete review of systems is positive for none.  All other systems are reviewed and negative.   Physical Exam: VS:  BP 108/68   Pulse 68   Ht 5\' 6"  (1.676 m)   Wt 181 lb 9.6 oz (82.4 kg)   SpO2 96%   BMI 29.31 kg/m , BMI Body mass index is 29.31 kg/m.  Wt Readings from Last 3 Encounters:  08/31/18 181 lb 9.6 oz (82.4 kg)  04/18/17 180 lb (81.6 kg)  03/29/17 180 lb 3.2 oz (81.7 kg)    General: Patient appears comfortable at rest. HEENT: Conjunctiva and lids normal, oropharynx clear. Neck: Supple, no elevated JVP or carotid bruits, no thyromegaly. Lungs: Clear to auscultation, nonlabored breathing at rest. Cardiac: Regular rate and rhythm, no S3 or significant systolic murmur, no pericardial rub. Abdomen: Soft, nontender, bowel sounds present. Extremities: No pitting edema, distal pulses 2+. Skin: Warm and dry. Musculoskeletal: No kyphosis. Neuropsychiatric: Alert and oriented x3, affect grossly appropriate.  ECG: I personally reviewed the tracing from 10/04/2016 which showed normal sinus rhythm.  Recent Labwork:  May 2018: Potassium 4.1, BUN 18, creatinine 0.88, hemoglobin 12.1, platelets 326  Other Studies Reviewed Today:  Renal arterial Dopplers 11/10/2016: Normal caliber abdominal aorta, normal renal arteries bilaterally, normal bilateral kidney size, patent IVC and renal veins.  Assessment and Plan:  1.  Essential hypertension, blood pressure is well controlled today.  No changes made to current regimen.  Keep follow-up with Dr. Phillips Odor.  2.  Mixed hyperlipidemia, currently on Lipitor and omega-3 supplements.  She is following with Dr. Phillips Odor and reports lipid panel in the last 6 months.  3.   Previous work-up for secondary causes of hypertension reassuring.  Renal artery Dopplers were normal in 2018.  She does not have a clear indication for aspirin, I told her that she could consider stopping it.  Current medicines were reviewed with the patient today.   Orders Placed This Encounter  Procedures  . EKG 12-Lead    Disposition: Follow-up in 1 year.  Signed, Jonelle Sidle, MD, [facility] 08/31/2018 1:12 PM    [facility] at [facility] [address] [place], [place], [place] [address] Phone: [phone]; Fax: [phone]

## 2018-08-31 ENCOUNTER — Ambulatory Visit (INDEPENDENT_AMBULATORY_CARE_PROVIDER_SITE_OTHER): Payer: Self-pay | Admitting: Cardiology

## 2018-08-31 ENCOUNTER — Encounter: Payer: Self-pay | Admitting: Cardiology

## 2018-08-31 VITALS — BP 108/68 | HR 68 | Ht 66.0 in | Wt 181.6 lb

## 2018-08-31 DIAGNOSIS — I10 Essential (primary) hypertension: Secondary | ICD-10-CM

## 2018-08-31 DIAGNOSIS — E782 Mixed hyperlipidemia: Secondary | ICD-10-CM

## 2018-08-31 NOTE — Patient Instructions (Signed)

## 2018-09-13 ENCOUNTER — Other Ambulatory Visit: Payer: Self-pay | Admitting: Cardiovascular Disease

## 2018-09-13 IMAGING — MG DIGITAL SCREENING BILATERAL MAMMOGRAM WITH CAD
4 series · 4 of 4 positions shown · non-contrast
Comparison: Previous exam(s).

CLINICAL DATA: Screening.

EXAM:
DIGITAL SCREENING BILATERAL MAMMOGRAM WITH CAD

[R MLO]
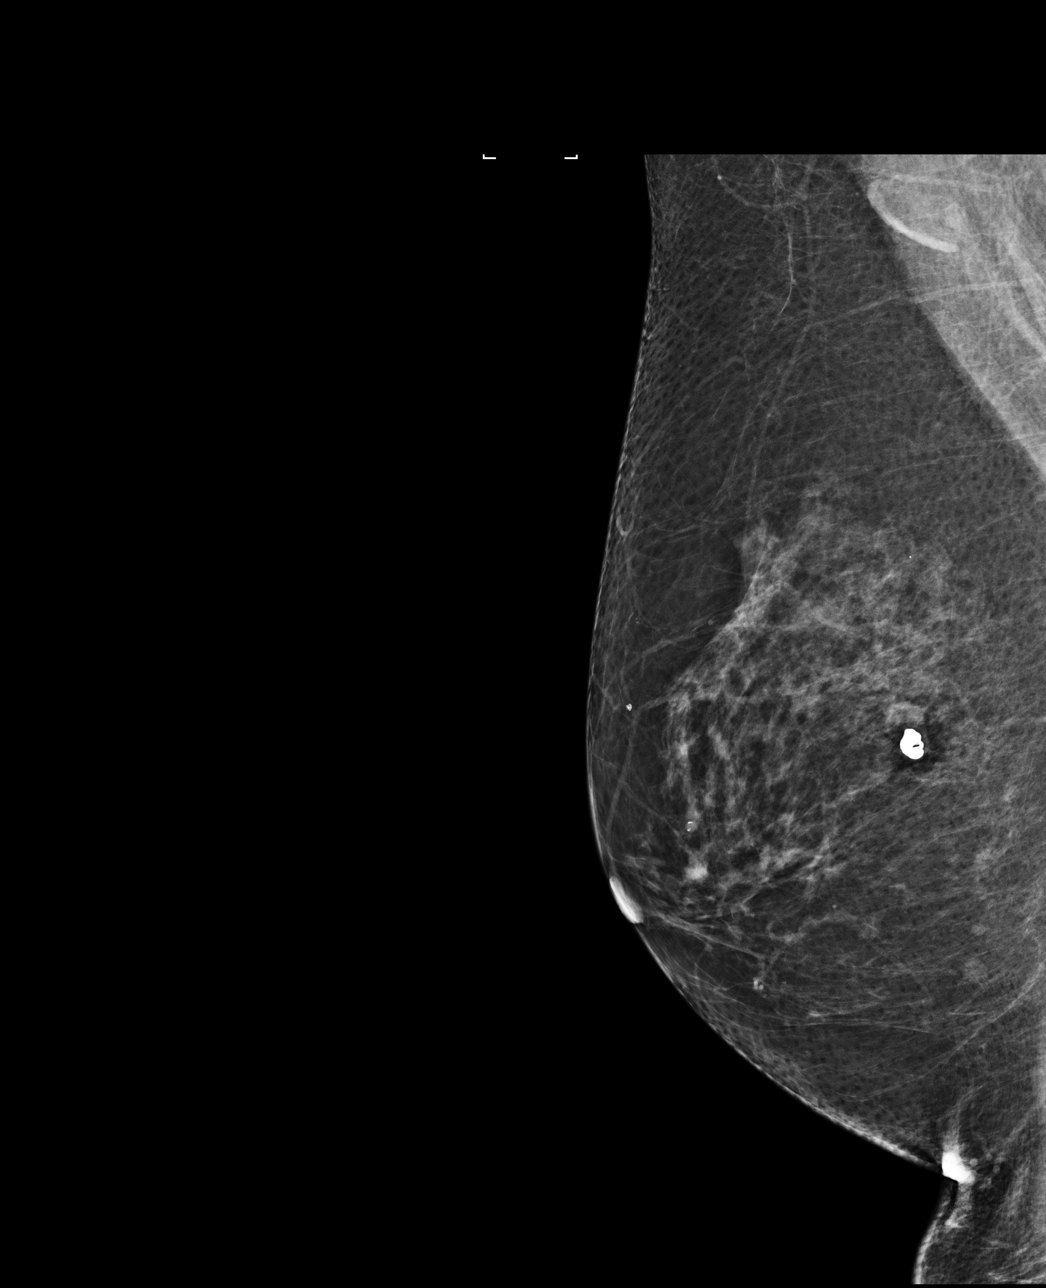

[L CC]
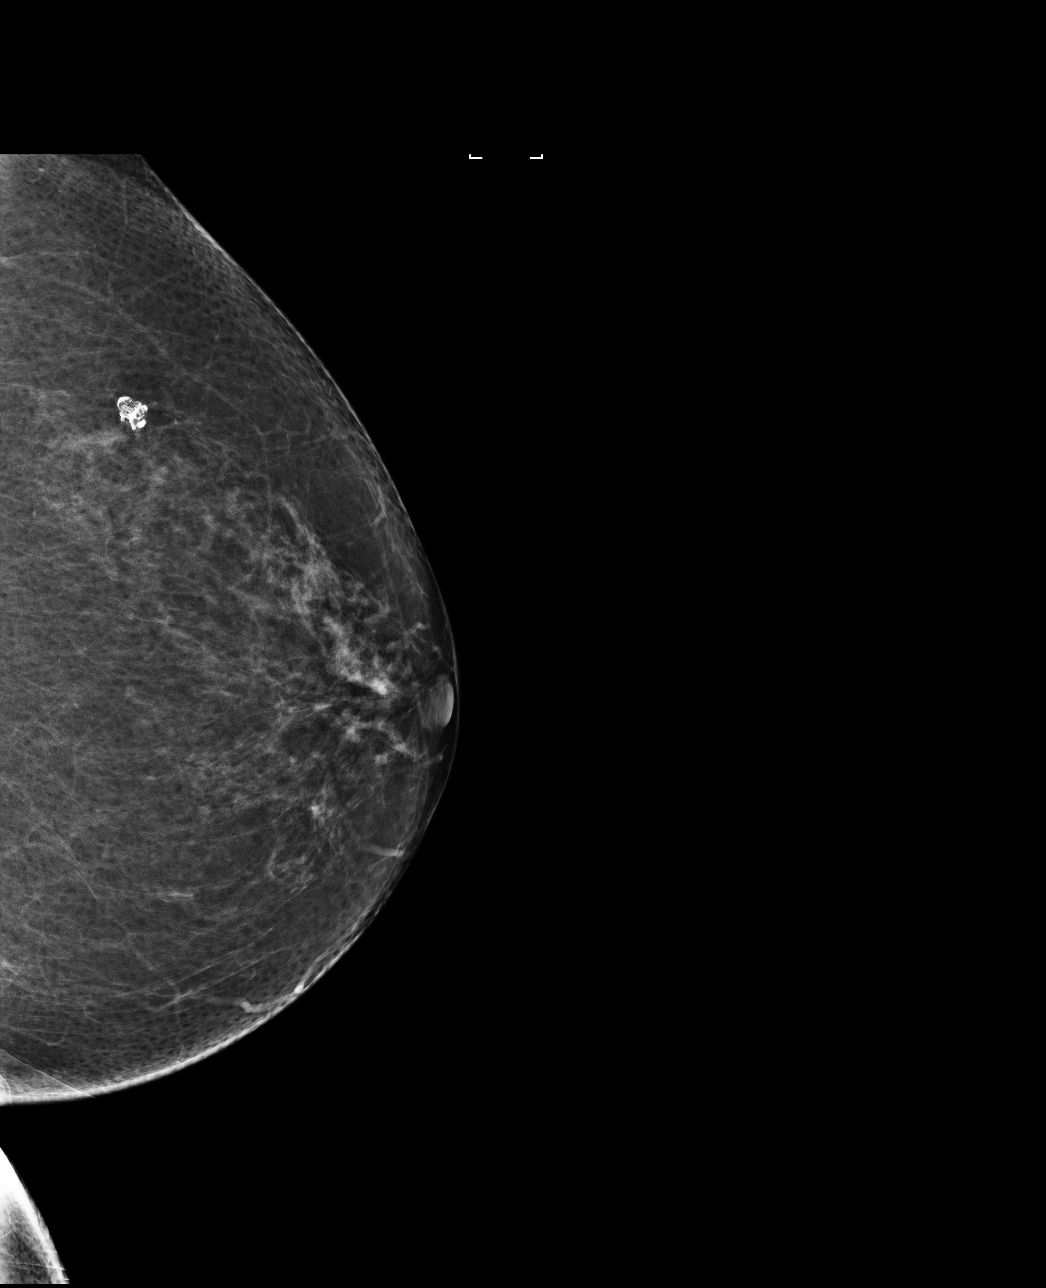

[R CC]
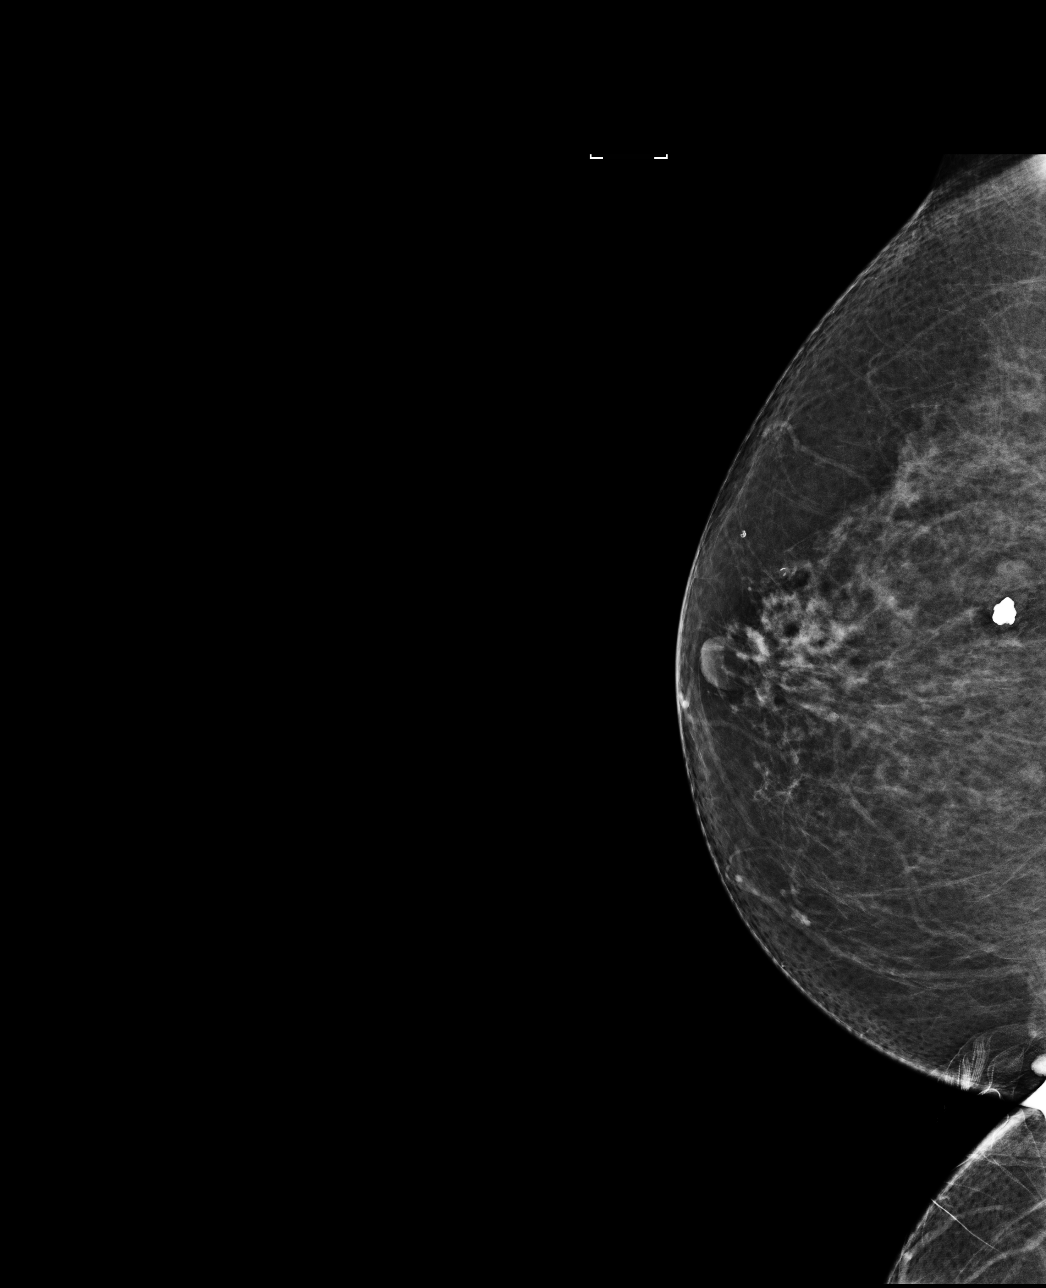

[L MLO]
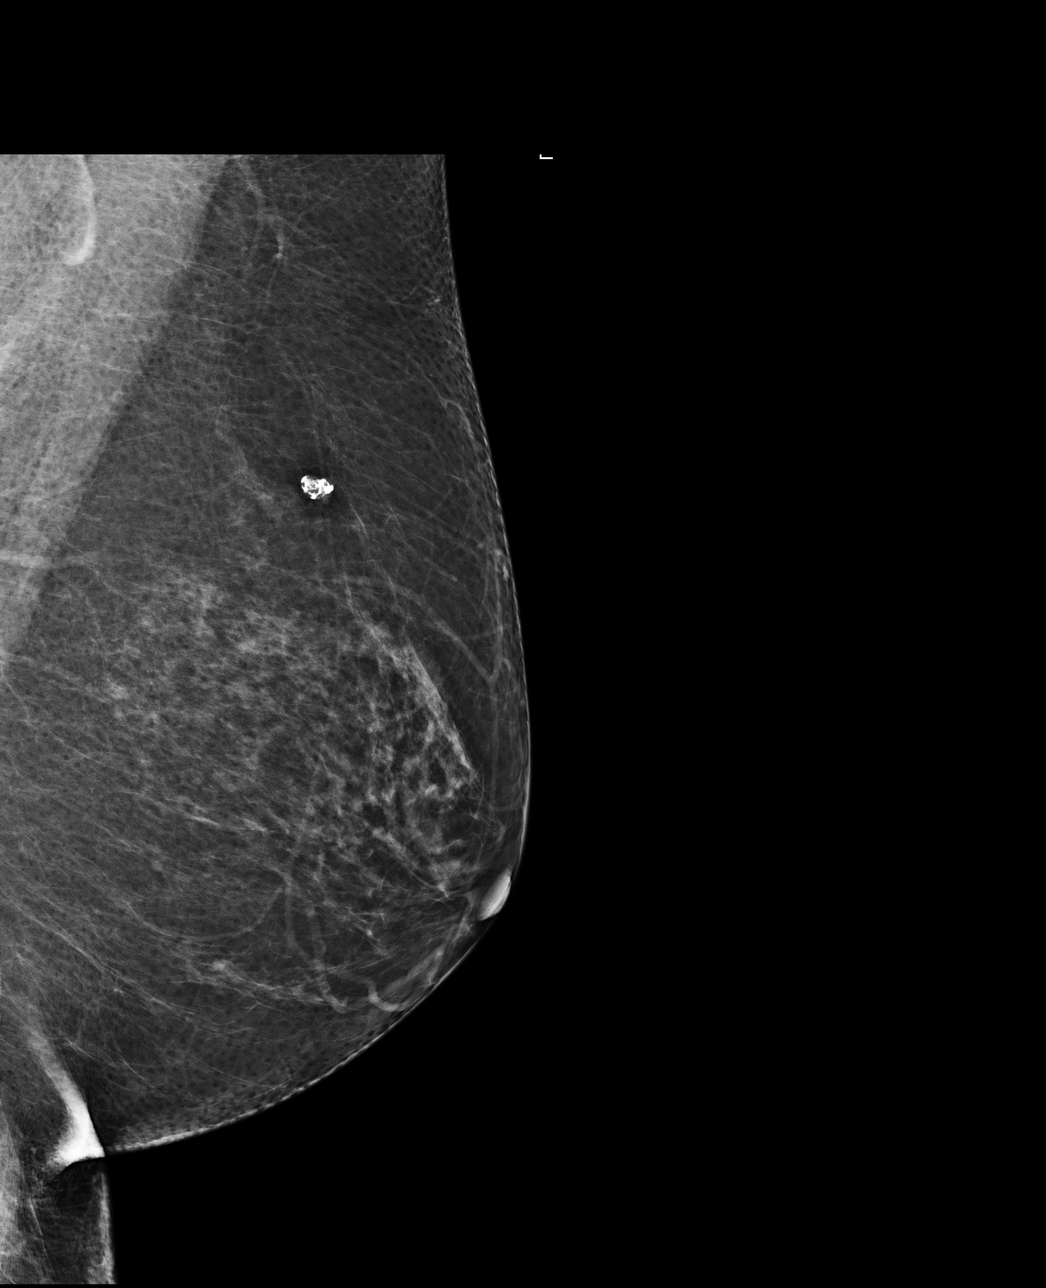

[4 of 4 positions shown; findings below may reference images not displayed]

ACR Breast Density Category c: The breast tissue is heterogeneously
dense, which may obscure small masses.
FINDINGS: There are no findings suspicious for malignancy. Images were
processed with CAD.
IMPRESSION: No mammographic evidence of malignancy. A result letter of this
screening mammogram will be mailed directly to the patient.

RECOMMENDATION:
Screening mammogram in one year. (Code:[id number])

BI-RADS CATEGORY  1: Negative.

## 2018-12-10 DIAGNOSIS — M4726 Other spondylosis with radiculopathy, lumbar region: Secondary | ICD-10-CM | POA: Diagnosis not present

## 2018-12-10 DIAGNOSIS — S233XXA Sprain of ligaments of thoracic spine, initial encounter: Secondary | ICD-10-CM | POA: Diagnosis not present

## 2019-01-07 DIAGNOSIS — S233XXA Sprain of ligaments of thoracic spine, initial encounter: Secondary | ICD-10-CM | POA: Diagnosis not present

## 2019-01-07 DIAGNOSIS — M4726 Other spondylosis with radiculopathy, lumbar region: Secondary | ICD-10-CM | POA: Diagnosis not present

## 2019-01-09 DIAGNOSIS — S233XXA Sprain of ligaments of thoracic spine, initial encounter: Secondary | ICD-10-CM | POA: Diagnosis not present

## 2019-01-09 DIAGNOSIS — M4726 Other spondylosis with radiculopathy, lumbar region: Secondary | ICD-10-CM | POA: Diagnosis not present

## 2019-01-16 DIAGNOSIS — M4726 Other spondylosis with radiculopathy, lumbar region: Secondary | ICD-10-CM | POA: Diagnosis not present

## 2019-01-16 DIAGNOSIS — S233XXA Sprain of ligaments of thoracic spine, initial encounter: Secondary | ICD-10-CM | POA: Diagnosis not present

## 2019-02-04 DIAGNOSIS — M4726 Other spondylosis with radiculopathy, lumbar region: Secondary | ICD-10-CM | POA: Diagnosis not present

## 2019-02-04 DIAGNOSIS — S233XXA Sprain of ligaments of thoracic spine, initial encounter: Secondary | ICD-10-CM | POA: Diagnosis not present

## 2019-02-07 DIAGNOSIS — M4726 Other spondylosis with radiculopathy, lumbar region: Secondary | ICD-10-CM | POA: Diagnosis not present

## 2019-02-07 DIAGNOSIS — S233XXA Sprain of ligaments of thoracic spine, initial encounter: Secondary | ICD-10-CM | POA: Diagnosis not present

## 2019-03-10 ENCOUNTER — Other Ambulatory Visit: Payer: Self-pay | Admitting: Nurse Practitioner

## 2019-03-17 ENCOUNTER — Other Ambulatory Visit: Payer: Self-pay | Admitting: Cardiovascular Disease

## 2019-04-10 ENCOUNTER — Other Ambulatory Visit: Payer: Self-pay | Admitting: Cardiology

## 2019-06-13 ENCOUNTER — Other Ambulatory Visit (HOSPITAL_COMMUNITY): Payer: Self-pay | Admitting: Family Medicine

## 2019-06-13 ENCOUNTER — Ambulatory Visit (HOSPITAL_COMMUNITY)
Admission: RE | Admit: 2019-06-13 | Discharge: 2019-06-13 | Disposition: A | Discharge status: Home / Self Care | Payer: BC Managed Care – PPO | Source: Ambulatory Visit | Attending: Family Medicine | Admitting: Family Medicine

## 2019-06-13 ENCOUNTER — Other Ambulatory Visit: Payer: Self-pay

## 2019-06-13 DIAGNOSIS — M5431 Sciatica, right side: Secondary | ICD-10-CM | POA: Diagnosis not present

## 2019-06-13 DIAGNOSIS — S99911A Unspecified injury of right ankle, initial encounter: Secondary | ICD-10-CM | POA: Diagnosis not present

## 2019-06-13 DIAGNOSIS — Z6828 Body mass index (BMI) 28.0-28.9, adult: Secondary | ICD-10-CM | POA: Diagnosis not present

## 2019-06-13 DIAGNOSIS — E663 Overweight: Secondary | ICD-10-CM | POA: Diagnosis not present

## 2019-06-13 DIAGNOSIS — S9001XA Contusion of right ankle, initial encounter: Secondary | ICD-10-CM | POA: Diagnosis not present

## 2019-06-13 DIAGNOSIS — Z23 Encounter for immunization: Secondary | ICD-10-CM | POA: Diagnosis not present

## 2019-06-13 DIAGNOSIS — Z1389 Encounter for screening for other disorder: Secondary | ICD-10-CM | POA: Diagnosis not present

## 2019-06-26 DIAGNOSIS — M4726 Other spondylosis with radiculopathy, lumbar region: Secondary | ICD-10-CM | POA: Diagnosis not present

## 2019-06-26 DIAGNOSIS — S233XXA Sprain of ligaments of thoracic spine, initial encounter: Secondary | ICD-10-CM | POA: Diagnosis not present

## 2019-07-01 DIAGNOSIS — S233XXA Sprain of ligaments of thoracic spine, initial encounter: Secondary | ICD-10-CM | POA: Diagnosis not present

## 2019-07-01 DIAGNOSIS — M4726 Other spondylosis with radiculopathy, lumbar region: Secondary | ICD-10-CM | POA: Diagnosis not present

## 2019-07-09 DIAGNOSIS — S233XXA Sprain of ligaments of thoracic spine, initial encounter: Secondary | ICD-10-CM | POA: Diagnosis not present

## 2019-07-09 DIAGNOSIS — M4726 Other spondylosis with radiculopathy, lumbar region: Secondary | ICD-10-CM | POA: Diagnosis not present

## 2019-07-15 DIAGNOSIS — S233XXA Sprain of ligaments of thoracic spine, initial encounter: Secondary | ICD-10-CM | POA: Diagnosis not present

## 2019-07-15 DIAGNOSIS — M4726 Other spondylosis with radiculopathy, lumbar region: Secondary | ICD-10-CM | POA: Diagnosis not present

## 2019-07-22 DIAGNOSIS — S233XXA Sprain of ligaments of thoracic spine, initial encounter: Secondary | ICD-10-CM | POA: Diagnosis not present

## 2019-07-22 DIAGNOSIS — M4726 Other spondylosis with radiculopathy, lumbar region: Secondary | ICD-10-CM | POA: Diagnosis not present

## 2019-08-05 DIAGNOSIS — M4726 Other spondylosis with radiculopathy, lumbar region: Secondary | ICD-10-CM | POA: Diagnosis not present

## 2019-08-05 DIAGNOSIS — S233XXA Sprain of ligaments of thoracic spine, initial encounter: Secondary | ICD-10-CM | POA: Diagnosis not present

## 2019-08-07 ENCOUNTER — Other Ambulatory Visit: Payer: Self-pay | Admitting: Cardiology

## 2019-08-19 DIAGNOSIS — S233XXA Sprain of ligaments of thoracic spine, initial encounter: Secondary | ICD-10-CM | POA: Diagnosis not present

## 2019-08-19 DIAGNOSIS — S338XXA Sprain of other parts of lumbar spine and pelvis, initial encounter: Secondary | ICD-10-CM | POA: Diagnosis not present

## 2019-08-19 DIAGNOSIS — G441 Vascular headache, not elsewhere classified: Secondary | ICD-10-CM | POA: Diagnosis not present

## 2019-09-02 DIAGNOSIS — S338XXA Sprain of other parts of lumbar spine and pelvis, initial encounter: Secondary | ICD-10-CM | POA: Diagnosis not present

## 2019-09-02 DIAGNOSIS — G441 Vascular headache, not elsewhere classified: Secondary | ICD-10-CM | POA: Diagnosis not present

## 2019-09-02 DIAGNOSIS — S233XXA Sprain of ligaments of thoracic spine, initial encounter: Secondary | ICD-10-CM | POA: Diagnosis not present

## 2019-09-17 ENCOUNTER — Other Ambulatory Visit: Payer: Self-pay | Admitting: Cardiology

## 2019-09-17 ENCOUNTER — Other Ambulatory Visit: Payer: Self-pay | Admitting: Cardiovascular Disease

## 2019-09-25 DIAGNOSIS — Z23 Encounter for immunization: Secondary | ICD-10-CM | POA: Diagnosis not present

## 2019-10-15 ENCOUNTER — Other Ambulatory Visit: Payer: Self-pay | Admitting: Cardiovascular Disease

## 2019-10-15 ENCOUNTER — Other Ambulatory Visit: Payer: Self-pay | Admitting: Cardiology

## 2019-10-24 DIAGNOSIS — Z23 Encounter for immunization: Secondary | ICD-10-CM | POA: Diagnosis not present

## 2019-11-07 ENCOUNTER — Other Ambulatory Visit: Payer: Self-pay | Admitting: Cardiovascular Disease

## 2019-11-07 ENCOUNTER — Other Ambulatory Visit: Payer: Self-pay | Admitting: Cardiology

## 2019-11-12 DIAGNOSIS — K5792 Diverticulitis of intestine, part unspecified, without perforation or abscess without bleeding: Secondary | ICD-10-CM | POA: Diagnosis not present

## 2019-11-12 DIAGNOSIS — Z681 Body mass index (BMI) 19 or less, adult: Secondary | ICD-10-CM | POA: Diagnosis not present

## 2019-11-20 DIAGNOSIS — Z23 Encounter for immunization: Secondary | ICD-10-CM | POA: Diagnosis not present

## 2019-12-09 ENCOUNTER — Other Ambulatory Visit: Payer: Self-pay | Admitting: Cardiology

## 2019-12-09 ENCOUNTER — Other Ambulatory Visit: Payer: Self-pay | Admitting: Cardiovascular Disease

## 2019-12-13 ENCOUNTER — Other Ambulatory Visit: Payer: Self-pay

## 2019-12-13 MED ORDER — LOSARTAN POTASSIUM 100 MG PO TABS: ORAL_TABLET | ORAL | 0 refills | Status: DC

## 2020-02-04 ENCOUNTER — Other Ambulatory Visit: Payer: Self-pay | Admitting: Cardiovascular Disease

## 2020-03-02 ENCOUNTER — Other Ambulatory Visit: Payer: Self-pay | Admitting: Gastroenterology

## 2020-03-04 NOTE — Telephone Encounter (Signed)
Patient needs ov for refills. Last seen in 2018. One month supply provided

## 2020-03-11 ENCOUNTER — Encounter: Payer: Self-pay | Admitting: Internal Medicine

## 2020-03-30 ENCOUNTER — Other Ambulatory Visit: Payer: Self-pay | Admitting: Cardiology

## 2020-03-31 ENCOUNTER — Other Ambulatory Visit: Payer: Self-pay | Admitting: Gastroenterology

## 2020-10-21 ENCOUNTER — Other Ambulatory Visit (HOSPITAL_COMMUNITY): Payer: Self-pay | Admitting: Physician Assistant

## 2020-10-21 DIAGNOSIS — Z1231 Encounter for screening mammogram for malignant neoplasm of breast: Secondary | ICD-10-CM

## 2020-10-21 DIAGNOSIS — Z0000 Encounter for general adult medical examination without abnormal findings: Secondary | ICD-10-CM | POA: Diagnosis not present

## 2020-10-21 DIAGNOSIS — Z0001 Encounter for general adult medical examination with abnormal findings: Secondary | ICD-10-CM | POA: Diagnosis not present

## 2020-10-21 DIAGNOSIS — Z6827 Body mass index (BMI) 27.0-27.9, adult: Secondary | ICD-10-CM | POA: Diagnosis not present

## 2020-10-21 DIAGNOSIS — Z1389 Encounter for screening for other disorder: Secondary | ICD-10-CM | POA: Diagnosis not present

## 2020-10-27 ENCOUNTER — Other Ambulatory Visit: Payer: Self-pay | Admitting: Cardiology

## 2020-11-11 ENCOUNTER — Ambulatory Visit (HOSPITAL_COMMUNITY)
Admission: RE | Admit: 2020-11-11 | Discharge: 2020-11-11 | Disposition: A | Discharge status: Home / Self Care | Payer: BC Managed Care – PPO | Source: Ambulatory Visit | Attending: Physician Assistant | Admitting: Physician Assistant

## 2020-11-11 DIAGNOSIS — Z1231 Encounter for screening mammogram for malignant neoplasm of breast: Secondary | ICD-10-CM | POA: Diagnosis not present

## 2020-11-18 DIAGNOSIS — F329 Major depressive disorder, single episode, unspecified: Secondary | ICD-10-CM | POA: Diagnosis not present

## 2020-11-18 DIAGNOSIS — F419 Anxiety disorder, unspecified: Secondary | ICD-10-CM | POA: Diagnosis not present

## 2020-11-18 DIAGNOSIS — I10 Essential (primary) hypertension: Secondary | ICD-10-CM | POA: Diagnosis not present

## 2020-11-18 DIAGNOSIS — Z6826 Body mass index (BMI) 26.0-26.9, adult: Secondary | ICD-10-CM | POA: Diagnosis not present

## 2021-02-22 DIAGNOSIS — F419 Anxiety disorder, unspecified: Secondary | ICD-10-CM | POA: Diagnosis not present

## 2021-02-22 DIAGNOSIS — U071 COVID-19: Secondary | ICD-10-CM | POA: Diagnosis not present

## 2021-02-22 DIAGNOSIS — I10 Essential (primary) hypertension: Secondary | ICD-10-CM | POA: Diagnosis not present

## 2021-04-20 DIAGNOSIS — Z23 Encounter for immunization: Secondary | ICD-10-CM | POA: Diagnosis not present

## 2021-06-07 ENCOUNTER — Encounter (HOSPITAL_COMMUNITY): Payer: Self-pay

## 2021-06-07 ENCOUNTER — Emergency Department (HOSPITAL_COMMUNITY): Payer: BC Managed Care – PPO

## 2021-06-07 ENCOUNTER — Emergency Department (HOSPITAL_COMMUNITY)
Admission: EM | Admit: 2021-06-07 | Discharge: 2021-06-07 | Disposition: A | Discharge status: Home / Self Care | Payer: BC Managed Care – PPO | Attending: Emergency Medicine | Admitting: Emergency Medicine

## 2021-06-07 ENCOUNTER — Other Ambulatory Visit: Payer: Self-pay

## 2021-06-07 DIAGNOSIS — Y990 Civilian activity done for income or pay: Secondary | ICD-10-CM | POA: Diagnosis not present

## 2021-06-07 DIAGNOSIS — R55 Syncope and collapse: Secondary | ICD-10-CM | POA: Insufficient documentation

## 2021-06-07 DIAGNOSIS — Z23 Encounter for immunization: Secondary | ICD-10-CM | POA: Diagnosis not present

## 2021-06-07 DIAGNOSIS — W01198A Fall on same level from slipping, tripping and stumbling with subsequent striking against other object, initial encounter: Secondary | ICD-10-CM | POA: Diagnosis not present

## 2021-06-07 DIAGNOSIS — I10 Essential (primary) hypertension: Secondary | ICD-10-CM | POA: Insufficient documentation

## 2021-06-07 DIAGNOSIS — S0101XA Laceration without foreign body of scalp, initial encounter: Secondary | ICD-10-CM | POA: Diagnosis not present

## 2021-06-07 DIAGNOSIS — M7989 Other specified soft tissue disorders: Secondary | ICD-10-CM | POA: Diagnosis not present

## 2021-06-07 DIAGNOSIS — S5002XA Contusion of left elbow, initial encounter: Secondary | ICD-10-CM | POA: Diagnosis not present

## 2021-06-07 DIAGNOSIS — J449 Chronic obstructive pulmonary disease, unspecified: Secondary | ICD-10-CM | POA: Diagnosis not present

## 2021-06-07 DIAGNOSIS — W19XXXA Unspecified fall, initial encounter: Secondary | ICD-10-CM

## 2021-06-07 DIAGNOSIS — Z79899 Other long term (current) drug therapy: Secondary | ICD-10-CM | POA: Insufficient documentation

## 2021-06-07 DIAGNOSIS — S0990XA Unspecified injury of head, initial encounter: Secondary | ICD-10-CM | POA: Diagnosis not present

## 2021-06-07 DIAGNOSIS — R9431 Abnormal electrocardiogram [ECG] [EKG]: Secondary | ICD-10-CM | POA: Diagnosis not present

## 2021-06-07 LAB — CBC WITH DIFFERENTIAL/PLATELET
Abs Immature Granulocytes: 0.02 10*3/uL (ref 0.00–0.07)
Basophils Absolute: 0.1 10*3/uL (ref 0.0–0.1)
Basophils Relative: 1 %
Eosinophils Absolute: 0.1 10*3/uL (ref 0.0–0.5)
Eosinophils Relative: 1 %
HCT: 41.7 % (ref 36.0–46.0)
Hemoglobin: 12.9 g/dL (ref 12.0–15.0)
Immature Granulocytes: 0 %
Lymphocytes Relative: 12 %
Lymphs Abs: 1 10*3/uL (ref 0.7–4.0)
MCH: 26.4 pg (ref 26.0–34.0)
MCHC: 30.9 g/dL (ref 30.0–36.0)
MCV: 85.5 fL (ref 80.0–100.0)
Monocytes Absolute: 0.4 10*3/uL (ref 0.1–1.0)
Monocytes Relative: 5 %
Neutro Abs: 6.7 10*3/uL (ref 1.7–7.7)
Neutrophils Relative %: 81 %
Platelets: 297 10*3/uL (ref 150–400)
RBC: 4.88 MIL/uL (ref 3.87–5.11)
RDW: 13.2 % (ref 11.5–15.5)
WBC: 8.2 10*3/uL (ref 4.0–10.5)
nRBC: 0 % (ref 0.0–0.2)

## 2021-06-07 LAB — COMPREHENSIVE METABOLIC PANEL
ALT: 31 U/L (ref 0–44)
AST: 26 U/L (ref 15–41)
Albumin: 4.1 g/dL (ref 3.5–5.0)
Alkaline Phosphatase: 108 U/L (ref 38–126)
Anion gap: 8 (ref 5–15)
BUN: 14 mg/dL (ref 6–20)
CO2: 25 mmol/L (ref 22–32)
Calcium: 8.8 mg/dL — ABNORMAL LOW (ref 8.9–10.3)
Chloride: 105 mmol/L (ref 98–111)
Creatinine, Ser: 0.82 mg/dL (ref 0.44–1.00)
GFR, Estimated: >60 mL/min (ref >60)
Glucose, Bld: 126 mg/dL — ABNORMAL HIGH (ref 70–99)
Potassium: 3.4 mmol/L — ABNORMAL LOW (ref 3.5–5.1)
Sodium: 138 mmol/L (ref 135–145)
Total Bilirubin: 0.8 mg/dL (ref 0.3–1.2)
Total Protein: 6.9 g/dL (ref 6.5–8.1)

## 2021-06-07 LAB — PROTIME-INR
INR: 1.1 (ref 0.8–1.2)
Prothrombin Time: 14 seconds (ref 11.4–15.2)

## 2021-06-07 MED ORDER — LIDOCAINE-EPINEPHRINE (PF) 2 %-1:200000 IJ SOLN
20.0000 mL | Freq: Once | INTRAMUSCULAR | Status: DC
  Filled 2021-06-07: qty 20

## 2021-06-07 MED ORDER — TETANUS-DIPHTH-ACELL PERTUSSIS 5-2.5-18.5 LF-MCG/0.5 IM SUSY
0.5000 mL | PREFILLED_SYRINGE | Freq: Once | INTRAMUSCULAR | Status: AC
  Administered 2021-06-07: 0.5 mL via INTRAMUSCULAR
  Filled 2021-06-07: qty 0.5

## 2021-06-07 NOTE — ED Provider Notes (Signed)
..  Laceration Repair  Date/Time: 06/07/2021 7:10 PM Performed by: Eber Hong, MD Authorized by: Eber Hong, MD   Consent:    Consent obtained:  Verbal   Consent given by:  Patricia Keith   Risks discussed:  Infection, pain, need for additional repair, poor cosmetic result and poor wound healing   Alternatives discussed:  No treatment and delayed treatment Universal protocol:    Procedure explained and questions answered to Patricia Keith or proxy's satisfaction: yes     Relevant documents present and verified: yes     Test results available: yes     Imaging studies available: yes     Required blood products, implants, devices, and special equipment available: yes     Site/side marked: yes     Immediately prior to procedure, a time out was called: yes     Patricia Keith identity confirmed:  Verbally with Patricia Keith Anesthesia:    Anesthesia method:  Local infiltration   Local anesthetic:  Lidocaine 1% WITH epi Laceration details:    Location:  Scalp   Scalp location:  L temporal   Length (cm):  4   Depth (mm):  4 Pre-procedure details:    Preparation:  Patricia Keith was prepped and draped in usual sterile fashion and imaging obtained to evaluate for foreign bodies Exploration:    Limited defect created (wound extended): no     Hemostasis achieved with:  Direct pressure and tied off vessels   Imaging outcome: foreign body not noted     Wound exploration: wound explored through full range of motion     Wound extent: no fascia violation noted, no foreign bodies/material noted, no muscle damage noted, no nerve damage noted, no tendon damage noted, no underlying fracture noted and no vascular damage noted   Treatment:    Area cleansed with:  Saline   Amount of cleaning:  Standard   Irrigation solution:  Sterile saline   Irrigation method:  Syringe Skin repair:    Repair method:  Sutures   Suture size:  3-0   Suture material:  Prolene   Suture technique:  Horizontal mattress   Number of sutures:   3 Approximation:    Approximation:  Close Repair type:    Repair type:  Simple Post-procedure details:    Dressing:  Antibiotic ointment and sterile dressing   Procedure completion:  Tolerated well, no immediate complications Comments:      There were 2 horizontal mattress sutures that were placed, there was a third suture that was placed as a suture ligation technique with a Vicryl at the anterior aspect of the linear laceration where there was a bleeding arterial vessel.  This appeared to be a small vessel but needed suture ligation     Eber Hong, MD 06/07/21 [id number]

## 2021-06-07 NOTE — ED Provider Notes (Signed)
[facility] Provider Note   CSN: [id number] Arrival date & time: 06/07/21  1300     History Chief Complaint  Patient presents with   Fall    Patricia Keith is a 60 y.o. female.  60 year old female with history of hypertension, migraines, Mnire's disease presents with complaint of syncope and large laceration to left side of head.  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  Patient does not recall the episode, states that she woke up on the ground with EMS.  Friend at bedside who witnessed the event and says that the patient fell into the person standing next to her, then into the door jam before hitting the ground. Patient states that she had a bad migraine yesterday, reports this to be a normal migraine for her, no new or different symptoms.  Did not eat or drink per usual yesterday due to feeling unwell.  Reports feeling dizzy at times with changes in head position which is common with her Mnire's.  Denies nausea, vomiting, chest pain or palpitations, unilateral weakness or numbness.  States that she also has some pain in her left elbow.  No other injuries, complaints, concerns.  Patient is not anticoagulated.      Past Medical History:  Diagnosis Date   COPD (chronic obstructive pulmonary disease) ([place])    Hypertension    Meniere disease 2015   Migraines     Patient Active Problem List   Diagnosis Date Noted   Erosive gastritis    Dysphagia 03/29/2017   GERD (gastroesophageal reflux disease) 03/29/2017   COPD (chronic obstructive pulmonary disease) ([place]) 03/08/2017   IBS (irritable bowel syndrome) 09/01/2016   Normocytic anemia 09/01/2016   Essential hypertension 01/29/2016   Change in bowel habits 01/20/2016   Cough variant asthma vs Upper airway cough syndrome 11/23/2015   Multiple pulmonary nodules 11/23/2015   Elevated alkaline phosphatase level 10/22/2015    Abnormal ultrasound of abdomen 10/22/2015   Special screening for malignant neoplasms, colon     Past Surgical History:  Procedure Laterality Date   APPENDECTOMY     BACTERIAL OVERGROWTH TEST N/A 01/28/2016   Procedure: BACTERIAL OVERGROWTH TEST;  Surgeon: Danie Binder, MD;  [facility];  Service: Endoscopy;  Laterality: N/A;  0700   COLONOSCOPY N/A 08/31/2015   SLF: LYMPHOID POLYP, [place] TICS, SML IH     ESOPHAGOGASTRODUODENOSCOPY N/A 04/18/2017   Procedure: ESOPHAGOGASTRODUODENOSCOPY (EGD);  Surgeon: Danie Binder, MD;  [facility];  Service: Endoscopy;  Laterality: N/A;  145 - pt can not move   SAVORY DILATION N/A 04/18/2017   Procedure: SAVORY DILATION;  Surgeon: Danie Binder, MD;  [facility];  Service: Endoscopy;  Laterality: N/A;   TONSILLECTOMY       OB History     Gravida  4   Para  3   Term  3   Preterm      AB  1   Living         SAB  1   IAB      Ectopic      Multiple      Live Births              Family History  Problem Relation Age of Onset   Allergies Other        "all of [place]"   COPD Mother  Breast cancer Mother    Hypertension Mother    Asthma Daughter    Heart disease Father    Hypertension Father    Hypertension Paternal Grandfather    Heart disease Paternal Grandfather    Hypertension Paternal Grandmother    Heart disease Paternal Grandmother    Diabetes Maternal Grandfather    Colon cancer Neg Hx    Colon polyps Neg Hx     Social History   Tobacco Use   Smoking status: Never   Smokeless tobacco: Never   Tobacco comments:    Never smoked  Vaping Use   Vaping Use: Never used  Substance Use Topics   Alcohol use: No    Alcohol/week: 0.0 standard drinks   Drug use: No    Home Medications Prior to Admission medications   Medication Sig Start Date End Date Taking? Authorizing Provider  atorvastatin (LIPITOR) 20 MG tablet Take 20 mg by mouth daily. 08/24/18  Yes [provider]  b complex vitamins tablet Take 1 tablet by mouth daily.   Yes [provider]  calcium carbonate (TUMS - DOSED IN MG ELEMENTAL CALCIUM) 500 MG chewable tablet Chew 2 tablets by mouth 2 (two) times daily as needed for indigestion or heartburn.   Yes [provider]  cholecalciferol (VITAMIN D3) 25 MCG (1000 UNIT) tablet Take 1,000 Units by mouth daily.   Yes [provider]  escitalopram (LEXAPRO) 10 MG tablet Take 10 mg by mouth daily. 06/04/21  Yes [provider]  labetalol (NORMODYNE) 200 MG tablet Take 100 mg by mouth 2 (two) times daily.   Yes [provider]  LORazepam (ATIVAN) 0.5 MG tablet Take 0.5 mg by mouth 2 (two) times daily. 05/05/21  Yes [provider]  meclizine (ANTIVERT) 25 MG tablet Take one every 6 hours for dizziness Patient taking differently: Take 25 mg by mouth every 6 (six) hours as needed for dizziness. Take one every 6 hours for dizziness 02/14/14  Yes Milton Ferguson, MD  topiramate (TOPAMAX) 25 MG tablet Take 25 mg by mouth 2 (two) times daily. 06/04/21  Yes [provider]  albuterol (PROVENTIL HFA;VENTOLIN HFA) 108 (90 Base) MCG/ACT inhaler Inhale 2 puffs into the lungs every 6 (six) hours as needed for wheezing or shortness of breath. Patient not taking: Reported on 06/07/2021 03/08/17   Ahmed Prima, Fransisco Hertz, PA-C  chlorthalidone (HYGROTON) 25 MG tablet TAKE 1 TABLET BY MOUTH EVERY DAY Patient not taking: No sig reported 03/30/20   Satira Sark, MD  hydrALAZINE (APRESOLINE) 25 MG tablet TAKE 1/2 TABLET BY MOUTH THREE TIMES DAILY Patient not taking: Reported on 06/07/2021 09/17/19   Satira Sark, MD  losartan (COZAAR) 100 MG tablet TAKE 1/2 TABLET BY MOUTH TWICE DAILY (NEEDS ANNUAL APPOINTMENT FOR REFILLS) Patient not taking: Reported on 06/07/2021 12/13/19   Lorretta Harp, MD  pantoprazole (PROTONIX) 40 MG tablet TAKE 1 TABLET BY MOUTH TWICE DAILY 30 MINUTES PRIOR TO MEALS Patient not  taking: Reported on 06/07/2021 03/04/20   Mahala Menghini, PA-C    Allergies    Patient has no known allergies.  Review of Systems   Review of Systems  Constitutional:  Negative for chills and fever.  Eyes:  Negative for visual disturbance.  Cardiovascular:  Negative for chest pain and palpitations.  Gastrointestinal:  Negative for nausea and vomiting.  Musculoskeletal:  Positive for arthralgias and joint swelling. Negative for back pain, gait problem, myalgias, neck pain and neck stiffness.  Skin:  Positive for  wound. Negative for rash.  Allergic/Immunologic: Negative for immunocompromised state.  Neurological:  Positive for dizziness, syncope and headaches. Negative for weakness.  Hematological:  Does not bruise/bleed easily.  Psychiatric/Behavioral:  Negative for confusion.   All other systems reviewed and are negative.  Physical Exam Updated Vital Signs BP (!) 173/93 (BP Location: Right Arm)   Pulse 64   Temp 97.8 F (36.6 C) (Oral)   Resp 15   Ht 5\' 6"  (1.676 m)   Wt 65.8 kg   SpO2 98%   BMI 23.40 kg/m   Physical Exam Vitals and nursing note reviewed.  Constitutional:      General: She is not in acute distress.    Appearance: She is well-developed. She is not diaphoretic.     Comments: Left parietal scalp laceration, bleeding controlled with pressure dressing  HENT:     Head: Normocephalic.     Nose: Nose normal.     Mouth/Throat:     Mouth: Mucous membranes are moist.  Eyes:     Extraocular Movements: Extraocular movements intact.     Pupils: Pupils are equal, round, and reactive to light.  Cardiovascular:     Rate and Rhythm: Normal rate and regular rhythm.     Heart sounds: Normal heart sounds.  Pulmonary:     Effort: Pulmonary effort is normal.     Breath sounds: Normal breath sounds.  Abdominal:     Palpations: Abdomen is soft.     Tenderness: There is no abdominal tenderness.  Musculoskeletal:        General: Swelling, tenderness and signs of injury  present. No deformity.     Left shoulder: Normal.     Left elbow: Swelling present. No lacerations. Normal range of motion. Tenderness present in lateral epicondyle. No radial head, medial epicondyle or olecranon process tenderness.     Left wrist: Normal.     Cervical back: Normal range of motion and neck supple. No tenderness.  Skin:    General: Skin is warm and dry.     Findings: No erythema or rash.  Neurological:     Mental Status: She is alert and oriented to person, place, and time.     Cranial Nerves: No cranial nerve deficit.     Sensory: No sensory deficit.     Motor: No weakness.  Psychiatric:        Behavior: Behavior normal.    ED Results / Procedures / Treatments   Labs (all labs ordered are listed, but only abnormal results are displayed) Labs Reviewed  COMPREHENSIVE METABOLIC PANEL - Abnormal; Notable for the following components:      Result Value   Potassium 3.4 (*)    Glucose, Bld 126 (*)    Calcium 8.8 (*)    All other components within normal limits  CBC WITH DIFFERENTIAL/PLATELET  PROTIME-INR    EKG None  Radiology DG Chest 1 View  Result Date: 06/07/2021 CLINICAL DATA:  Syncopal episode at work. Patient fell and injured head on concrete floor. EXAM: CHEST  1 VIEW COMPARISON:  Radiographs 06/08/2015 and 07/29/2014.  CT 10/16/2015. FINDINGS: 1409 hours. The heart size and mediastinal contours are normal. Stable small calcified granuloma at the right lung base. The lungs are otherwise clear. There is no pleural effusion or pneumothorax. No acute osseous findings are identified. IMPRESSION: No active cardiopulmonary process. Electronically Signed   By: Richardean Sale M.D.   On: 06/07/2021 14:47   DG Elbow Complete Left  Result Date: 06/07/2021 CLINICAL DATA:  Syncope, fall, bruising and pain EXAM: LEFT ELBOW - COMPLETE 3+ VIEW COMPARISON:  None. FINDINGS: There is no evidence of fracture, dislocation, or joint effusion. There is no evidence of arthropathy  or other focal bone abnormality. Soft tissues are unremarkable. IMPRESSION: No fracture or dislocation of the left elbow. No elbow joint effusion to suggest radiographically occult fracture. Electronically Signed   By: Jearld Lesch M.D.   On: 06/07/2021 14:45   CT Head Wo Contrast  Result Date: 06/07/2021 CLINICAL DATA:  Syncope, simple, normal neuro exam; large head laceration. Additional history provided: Patient with syncopal episode while at work, subsequent fall striking left side of head on concrete floor, large laceration to left side of head. EXAM: CT HEAD WITHOUT CONTRAST TECHNIQUE: Contiguous axial images were obtained from the base of the skull through the vertex without intravenous contrast. COMPARISON:  Prior head CT examinations 06/28/2016 and earlier. FINDINGS: Brain: Cerebral volume is normal. There is no acute intracranial hemorrhage. No demarcated cortical infarct. No extra-axial fluid collection. No evidence of an intracranial mass. No midline shift. Partially empty sella turcica. Vascular: No hyperdense vessel.  Atherosclerotic calcifications. Skull: Normal. Negative for fracture or focal lesion. Sinuses/Orbits: Visualized orbits show no acute finding. Near complete opacification of the right sphenoid sinus. Other: Left parietal scalp laceration with sizable surrounding hematoma. IMPRESSION: No evidence of acute intracranial abnormality. Left parietal scalp laceration with sizable surrounding hematoma. Chronic right sphenoid sinusitis. Electronically Signed   By: Jackey Loge D.O.   On: 06/07/2021 14:36    Procedures Procedures   Medications Ordered in ED Medications  lidocaine-EPINEPHrine (XYLOCAINE W/EPI) 2 %-1:200000 (PF) injection 20 mL (has no administration in time range)  Tdap (BOOSTRIX) injection 0.5 mL (0.5 mLs Intramuscular Given 06/07/21 1602)    ED Course  I have reviewed the triage vital signs and the nursing notes.  Pertinent labs & imaging results that were  available during my care of the patient were reviewed by me and considered in my medical decision making (see chart for details).  Clinical Course as of 06/07/21 1837  Mon Jun 07, 2021  5541 60 year old female with syncope resulting in left parietal scalp laceration.  Also found to have some bruising and tenderness over her left lateral epicondyle of her elbow.  Exam is otherwise unremarkable. CT head negative for skull fracture or traumatic brain injury, does show left parietal hematoma.  Labs reviewed without significant findings eluding CBC, CMP, INR.  Patient is mildly hypertensive on arrival, history of hypertension.  X-ray of the left elbow is negative for acute fracture. Patient with extensive amount of blood in her hair requiring significant cleaning to evaluate her wound.  She is found to have a laceration to the left side of the scalp with arterial bleeding.  Pressure was applied and Dr. Hyacinth Meeker was called to bedside to assist with wound closure.  See his note for laceration repair. Patient reports feeling well, ready for discharge at this time.  Patient is placed on cardiac monitor and evaluated, no cardiac events observed during monitoring.  Plan is for patient to follow-up with her primary care provider for further work-up of her syncopal event today and wound check.  Advised to return to the ED for any worsening or concerning symptoms or should she have any additional syncopal events. [LM]    Clinical Course User Index [LM] Alden Hipp   MDM Rules/Calculators/A&P  Final Clinical Impression(s) / ED Diagnoses Final diagnoses:  Syncope, unspecified syncope type  Laceration of scalp, initial encounter    Rx / DC Orders ED Discharge Orders     None        Roque Lias 06/07/21 1837    Noemi Chapel, MD 06/07/21 1910

## 2021-06-07 NOTE — Discharge Instructions (Signed)
Return to the emergency room for any worsening or concerning symptoms or for further syncopal episodes.  Otherwise recommend follow-up with your primary care provider for further work-up of your syncopal event today.  Also recommend wound recheck with your doctor in 2 days, suture removal in about 10 days.

## 2021-06-07 NOTE — ED Triage Notes (Signed)
Patient had episode of syncope while at work. [employer] and struck left side of head on concrete floor. Noted with large laceration to left side of head. 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

## 2021-06-09 DIAGNOSIS — R55 Syncope and collapse: Secondary | ICD-10-CM | POA: Diagnosis not present

## 2021-06-09 DIAGNOSIS — E663 Overweight: Secondary | ICD-10-CM | POA: Diagnosis not present

## 2021-06-09 DIAGNOSIS — S0101XA Laceration without foreign body of scalp, initial encounter: Secondary | ICD-10-CM | POA: Diagnosis not present

## 2021-06-09 DIAGNOSIS — I10 Essential (primary) hypertension: Secondary | ICD-10-CM | POA: Diagnosis not present

## 2021-06-09 DIAGNOSIS — Z6823 Body mass index (BMI) 23.0-23.9, adult: Secondary | ICD-10-CM | POA: Diagnosis not present

## 2021-06-21 DIAGNOSIS — I10 Essential (primary) hypertension: Secondary | ICD-10-CM | POA: Diagnosis not present

## 2021-06-21 DIAGNOSIS — Z6824 Body mass index (BMI) 24.0-24.9, adult: Secondary | ICD-10-CM | POA: Diagnosis not present

## 2021-06-21 DIAGNOSIS — F419 Anxiety disorder, unspecified: Secondary | ICD-10-CM | POA: Diagnosis not present

## 2021-06-21 DIAGNOSIS — E663 Overweight: Secondary | ICD-10-CM | POA: Diagnosis not present

## 2021-08-04 NOTE — Progress Notes (Signed)
Cardiology Office Note  Date: 08/05/2021   ID: Patricia Keith, DOB [birth year], MRN [id number]  PCP:  Assunta Found, MD  Cardiologist:  Nona Dell, MD Electrophysiologist:  None   Chief Complaint  Patient presents with   History of syncope    History of Present Illness: Patricia Keith is a 61 y.o. female last seen in January 2020.  She is referred back to the office by Dr. Sherwood Gambler after ER visit at [facility] in November 2022 following an episode of apparent syncope.  I reviewed the ER note, she did have a laceration on the left side of her forehead that required suturing.  Head CT was negative for acute findings.  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  She does not recall any prodrome, next thing she remembers she was waking up on the ground.  The day before she did have a particularly bad migraine and vertigo symptoms, but did not feel this way prior to the event.  She does not describe any prior similar episodes, and has been at baseline ever since.  Orthostatic measurements today were not diagnostic.  Systolic did drop from the 170s (supine, seated, standing) to the 140s (standing after 3 minutes), heart rate 60s to 70s.  Blood pressure significantly elevated when she first came in, rechecked by me at 138/88.  She is on labetalol.  No sense of palpitations.  No exertional chest pain.  Past Medical History:  Diagnosis Date   COPD (chronic obstructive pulmonary disease) ([facility])    Hypertension    Meniere disease 2015   Migraines     Past Surgical History:  Procedure Laterality Date   APPENDECTOMY     BACTERIAL OVERGROWTH TEST N/A 01/28/2016   Procedure: BACTERIAL OVERGROWTH TEST;  Surgeon: West Bali, MD;  [facility];  Service: Endoscopy;  Laterality: N/A;  0700   COLONOSCOPY N/A 08/31/2015   SLF: LYMPHOID POLYP, [place] TICS, SML IH     ESOPHAGOGASTRODUODENOSCOPY N/A 04/18/2017    Procedure: ESOPHAGOGASTRODUODENOSCOPY (EGD);  Surgeon: [place] Bali, MD;  [facility];  Service: Endoscopy;  Laterality: N/A;  145 - pt can not move   SAVORY DILATION N/A 04/18/2017   Procedure: SAVORY DILATION;  Surgeon: [place] Bali, MD;  [facility];  Service: Endoscopy;  Laterality: N/A;   TONSILLECTOMY      Current Outpatient Medications  Medication Sig Dispense Refill   atorvastatin (LIPITOR) 20 MG tablet Take 20 mg by mouth daily.     b complex vitamins tablet Take 1 tablet by mouth daily.     calcium carbonate (TUMS - DOSED IN MG ELEMENTAL CALCIUM) 500 MG chewable tablet Chew 2 tablets by mouth 2 (two) times daily as needed for indigestion or heartburn.     cholecalciferol (VITAMIN D3) 25 MCG (1000 UNIT) tablet Take 1,000 Units by mouth daily.     escitalopram (LEXAPRO) 10 MG tablet Take 10 mg by mouth daily.     labetalol (NORMODYNE) 200 MG tablet Take 100 mg by mouth 2 (two) times daily.     LORazepam (ATIVAN) 0.5 MG tablet Take 0.5 mg by mouth 2 (two) times daily.     meclizine (ANTIVERT) 25 MG tablet Take one every 6 hours for dizziness (Patient taking differently: Take 25 mg by mouth every 6 (six) hours as needed for dizziness. Take one every 6 hours for dizziness)  28 tablet 0   topiramate (TOPAMAX) 25 MG tablet Take 25 mg by mouth 2 (two) times daily.     No current facility-administered medications for this visit.   Allergies:  Patient has no known allergies.   ROS: No orthopnea or PND.  Physical Exam: VS:  BP 138/88    Pulse 71    Ht 5\' 6"  (1.676 m)    Wt 152 lb 12.8 oz (69.3 kg)    SpO2 97%    BMI 24.66 kg/m , BMI Body mass index is 24.66 kg/m.  Wt Readings from Last 3 Encounters:  08/05/21 152 lb 12.8 oz (69.3 kg)  06/07/21 145 lb (65.8 kg)  08/31/18 181 lb 9.6 oz (82.4 kg)    General: Patient appears comfortable at rest. HEENT: Conjunctiva and lids normal, wearing a mask. Neck: Supple, no elevated JVP or carotid bruits, no  thyromegaly. Lungs: Clear to auscultation, nonlabored breathing at rest. Cardiac: Regular rate and rhythm, no S3 or significant systolic murmur, no pericardial rub. Extremities: No pitting edema.  ECG:  An ECG dated 06/07/2021 was personally reviewed today and demonstrated:  Sinus rhythm with borderline prolonged QT interval.  Recent Labwork: 06/07/2021: ALT 31; AST 26; BUN 14; Creatinine, Ser 0.82; Hemoglobin 12.9; Platelets 297; Potassium 3.4; Sodium 138   Other Studies Reviewed Today:  Chest x-ray 06/07/2021: FINDINGS: 1409 hours. The heart size and mediastinal contours are normal. Stable small calcified granuloma at the right lung base. The lungs are otherwise clear. There is no pleural effusion or pneumothorax. No acute osseous findings are identified.   IMPRESSION: No active cardiopulmonary process.  Assessment and Plan:  1.  Episode of syncope while standing back in November 2022 as outlined above.  No obvious etiology at this time, does not recall any prodrome.  She did have a bad migraine and vertigo symptoms the day before.  She has had no subsequent events and feels at baseline.  ECG reviewed from that day showing sinus rhythm with borderline prolonged QT interval.  Head CT was without acute findings and chest x-ray was without acute findings.  Orthostatic measurements were nondiagnostic today.  Plan is to obtain a 14-day Zio patch to screen for any potential paroxysmal arrhythmia or pauses.  Otherwise no change in baseline regimen.  2.  Essential hypertension, on labetalol.  Recheck blood pressure by me today was 138/88.  Medication Adjustments/Labs and Tests Ordered: Current medicines are reviewed at length with the patient today.  Concerns regarding medicines are outlined above.   Tests Ordered: No orders of the defined types were placed in this encounter.   Medication Changes: No orders of the defined types were placed in this encounter.   Disposition:  Follow up   test results.  Signed, December 2022, MD, [facility] 08/05/2021 10:55 AM    [facility] [address] [place], [place], [place] Phone: [phone]; Fax: [phone]

## 2021-08-05 ENCOUNTER — Ambulatory Visit (INDEPENDENT_AMBULATORY_CARE_PROVIDER_SITE_OTHER): Payer: BC Managed Care – PPO

## 2021-08-05 ENCOUNTER — Ambulatory Visit: Payer: BC Managed Care – PPO | Admitting: Cardiology

## 2021-08-05 ENCOUNTER — Other Ambulatory Visit: Payer: Self-pay | Admitting: Cardiology

## 2021-08-05 ENCOUNTER — Encounter: Payer: Self-pay | Admitting: Cardiology

## 2021-08-05 ENCOUNTER — Telehealth: Payer: Self-pay | Admitting: Cardiology

## 2021-08-05 VITALS — BP 138/88 | HR 71 | Ht 66.0 in | Wt 152.8 lb

## 2021-08-05 DIAGNOSIS — I10 Essential (primary) hypertension: Secondary | ICD-10-CM | POA: Diagnosis not present

## 2021-08-05 DIAGNOSIS — Z87898 Personal history of other specified conditions: Secondary | ICD-10-CM

## 2021-08-05 DIAGNOSIS — R55 Syncope and collapse: Secondary | ICD-10-CM

## 2021-08-05 NOTE — Telephone Encounter (Signed)
PERCERT:  14 Day ZIO AT dx: syncope 

## 2021-08-05 NOTE — Patient Instructions (Addendum)
Medication Instructions:  Your physician recommends that you continue on your current medications as directed. Please refer to the Current Medication list given to you today.  Labwork: none  Testing/Procedures: ZIO- Long Term Monitor Instructions   Your physician has requested you wear your ZIO AT patch monitor 14 days.   This is a single patch monitor.  [employer] supplies one patch monitor per enrollment.  Additional stickers are not available.   Please do not apply patch if you will be having a Nuclear Stress Test, Echocardiogram, Cardiac CT, MRI, or Chest Xray during the time frame you would be wearing the monitor. The patch cannot be worn during these tests.  You cannot remove and re-apply the ZIO AT patch monitor.   Your ZIO patch monitor will be sent USPS Priority mail from [facility] directly to your home address. The monitor may also be mailed to a PO BOX if home delivery is not available.   It may take 3-5 days to receive your monitor after you have been enrolled.   Once you have received you monitor, please review enclosed instructions.  Your monitor has already been registered assigning a specific monitor serial # to you.   Follow-Up: Your physician recommends that you schedule a follow-up appointment in: pending  Any Other Special Instructions Will Be Listed Below (If Applicable).  If you need a refill on your cardiac medications before your next appointment, please call your pharmacy.

## 2021-08-09 DIAGNOSIS — R55 Syncope and collapse: Secondary | ICD-10-CM

## 2021-08-11 DIAGNOSIS — R55 Syncope and collapse: Secondary | ICD-10-CM | POA: Diagnosis not present

## 2021-09-01 ENCOUNTER — Telehealth: Payer: Self-pay | Admitting: Cardiology

## 2021-09-01 NOTE — Telephone Encounter (Signed)
Patient is returning RN's call from 01/31 in regards to her results.

## 2021-09-02 ENCOUNTER — Telehealth: Payer: Self-pay | Admitting: *Deleted

## 2021-09-02 MED ORDER — LABETALOL HCL 200 MG PO TABS: 200.0000 mg | ORAL_TABLET | Freq: Two times a day (BID) | ORAL | 3 refills | Status: DC

## 2021-09-02 NOTE — Telephone Encounter (Signed)
-----   Message from Jonelle Sidle, MD sent at 08/30/2021  4:25 PM EST ----- Results reviewed.  Multiple episodes of brief PSVT were noted by cardiac monitor, longest event was about 20 seconds with heart rate in the 120s.  There were no patient triggered events, not entirely clear that these episodes of PSVT were symptom provoking or necessarily related to her episode of syncope.  She is already on labetalol 100 mg twice daily, suggest increasing to 200 mg twice daily for now.  Schedule follow-up in the next 3 months.

## 2021-09-02 NOTE — Telephone Encounter (Signed)
Results of monitor discussed with patient by K.Pinnix, LPN

## 2021-09-29 ENCOUNTER — Other Ambulatory Visit (HOSPITAL_COMMUNITY): Payer: Self-pay | Admitting: Family Medicine

## 2021-09-29 DIAGNOSIS — Z1231 Encounter for screening mammogram for malignant neoplasm of breast: Secondary | ICD-10-CM

## 2021-11-15 ENCOUNTER — Ambulatory Visit (HOSPITAL_COMMUNITY)
Admission: RE | Admit: 2021-11-15 | Discharge: 2021-11-15 | Disposition: A | Discharge status: Home / Self Care | Payer: BC Managed Care – PPO | Source: Ambulatory Visit | Attending: Family Medicine | Admitting: Family Medicine

## 2021-11-15 DIAGNOSIS — Z1231 Encounter for screening mammogram for malignant neoplasm of breast: Secondary | ICD-10-CM | POA: Diagnosis not present

## 2021-11-29 ENCOUNTER — Ambulatory Visit: Payer: BC Managed Care – PPO | Admitting: Cardiology

## 2021-11-29 ENCOUNTER — Encounter: Payer: Self-pay | Admitting: Cardiology

## 2021-11-29 VITALS — BP 134/78 | HR 79 | Ht 66.0 in | Wt 151.0 lb

## 2021-11-29 DIAGNOSIS — I10 Essential (primary) hypertension: Secondary | ICD-10-CM | POA: Diagnosis not present

## 2021-11-29 DIAGNOSIS — Z87898 Personal history of other specified conditions: Secondary | ICD-10-CM | POA: Diagnosis not present

## 2021-11-29 DIAGNOSIS — I471 Supraventricular tachycardia: Secondary | ICD-10-CM

## 2021-11-29 MED ORDER — METOPROLOL SUCCINATE ER 25 MG PO TB24: 25.0000 mg | ORAL_TABLET | Freq: Every day | ORAL | 6 refills | Status: DC

## 2021-11-29 NOTE — Patient Instructions (Addendum)
Medication Instructions:  ?Stop Labetalol (Normodyne) ?Begin Toprol XL 25mg  daily  ?Continue all other medications.    ? ?Labwork: ?none ? ?Testing/Procedures: ?none ? ?Follow-Up: ?3 months  ? ?Any Other Special Instructions Will Be Listed Below (If Applicable). ? ? ?If you need a refill on your cardiac medications before your next appointment, please call your pharmacy. ? ?

## 2021-11-29 NOTE — Progress Notes (Signed)
? ? ?Cardiology Office Note ? ?Date: 11/29/2021  ? ?ID: Yanni Elbert Ewings Crotty, DOB 1960-12-08, MRN [id number] ? ?PCP:  Assunta Found, MD  ?Cardiologist:  Nona Dell, MD ?Electrophysiologist:  None  ? ?Chief Complaint  ?Patient presents with  ? Cardiac follow-up  ? ? ?History of Present Illness: ?Patricia Keith is a 61 y.o. female last seen in January.  She is here for a follow-up visit. ? ?Cardiac monitor in January reviewed multiple brief episodes of PSVT as reviewed below.  Labetalol dose was increased to 200 mg twice daily.  She states that her blood pressure has been lower, she feels symptomatic with this.  No recurrent syncope.  Has had some tingling in her hands.  We discussed attempting a switch to Toprol-XL. ? ?Past Medical History:  ?Diagnosis Date  ? COPD (chronic obstructive pulmonary disease) (HCC)   ? Hypertension   ? Meniere disease 2015  ? Migraines   ? ? ?Past Surgical History:  ?Procedure Laterality Date  ? APPENDECTOMY    ? BACTERIAL OVERGROWTH TEST N/A 01/28/2016  ? Procedure: BACTERIAL OVERGROWTH TEST;  Surgeon: West Bali, MD;  [facility];  Service: Endoscopy;  Laterality: N/A;  0700  ? COLONOSCOPY N/A 08/31/2015  ? SLF: LYMPHOID POLYP, [place] TICS, SML IH    ? ESOPHAGOGASTRODUODENOSCOPY N/A 04/18/2017  ? Procedure: ESOPHAGOGASTRODUODENOSCOPY (EGD);  Surgeon: West Bali, MD;  [facility];  Service: Endoscopy;  Laterality: N/A;  145 - pt can not move  ? SAVORY DILATION N/A 04/18/2017  ? Procedure: SAVORY DILATION;  Surgeon: West Bali, MD;  [facility];  Service: Endoscopy;  Laterality: N/A;  ? TONSILLECTOMY    ? ? ?Current Outpatient Medications  ?Medication Sig Dispense Refill  ? acetaminophen (TYLENOL) 325 MG tablet Take 650 mg by mouth as needed.    ? atorvastatin (LIPITOR) 20 MG tablet Take 20 mg by mouth daily.    ? b complex vitamins tablet Take 1 tablet by mouth daily.    ? calcium carbonate (TUMS - DOSED IN MG ELEMENTAL CALCIUM) 500 MG chewable tablet  Chew 2 tablets by mouth 2 (two) times daily as needed for indigestion or heartburn.    ? cholecalciferol (VITAMIN D3) 25 MCG (1000 UNIT) tablet Take 1,000 Units by mouth daily.    ? escitalopram (LEXAPRO) 10 MG tablet Take 10 mg by mouth daily.    ? LORazepam (ATIVAN) 0.5 MG tablet Take 0.5 mg by mouth 2 (two) times daily.    ? meclizine (ANTIVERT) 25 MG tablet Take one every 6 hours for dizziness (Patient taking differently: Take 25 mg by mouth every 6 (six) hours as needed for dizziness. Take one every 6 hours for dizziness) 28 tablet 0  ? metoprolol succinate (TOPROL XL) 25 MG 24 hr tablet Take 1 tablet (25 mg total) by mouth daily. 30 tablet 6  ? topiramate (TOPAMAX) 25 MG tablet Take 25 mg by mouth 2 (two) times daily.    ? ?No current facility-administered medications for this visit.  ? ?Allergies:  Patient has no known allergies.  ? ?ROS: No orthopnea or PND. ? ?Physical Exam: ?VS:  BP 134/78   Pulse 79   Ht 5\' 6"  (1.676 m)   Wt 151 lb (68.5 kg)   SpO2 98%   BMI 24.37 kg/m? , BMI Body mass index is 24.37 kg/m?. ? ?Wt Readings from Last 3 Encounters:  ?11/29/21 151 lb (68.5 kg)  ?08/05/21 152 lb 12.8 oz (69.3 kg)  ?  06/07/21 145 lb (65.8 kg)  ?  ?General: Patient appears comfortable at rest. ?Lungs: Clear to auscultation, nonlabored breathing at rest. ?Cardiac: Regular rate and rhythm, no S3 or significant systolic murmur. ? ?ECG:  An ECG dated 06/07/2021 was personally reviewed today and demonstrated:  Sinus rhythm with borderline prolonged QT interval. ? ?Recent Labwork: ?06/07/2021: ALT 31; AST 26; BUN 14; Creatinine, Ser 0.82; Hemoglobin 12.9; Platelets 297; Potassium 3.4; Sodium 138  ? ?Other Studies Reviewed Today: ? ?Cardiac monitor January 2023: ?ZIO AT reviewed.  13 days, 22 hours analyzed.  Predominant rhythm is sinus with heart rate ranging from 51 bpm up to 132 bpm and average heart rate 67 bpm. ?  ?There were rare PACs including couplets and triplets representing less than 1% total beats.  There  were also rare PVCs including triplets representing less than 1% total beats. ?  ?Multiple, brief episodes of PSVT were noted.  The longest event was approximately 20 seconds with heart rate in the 120s.  No patient triggered events noted.  There were no pauses. ? ?Assessment and Plan: ? ?1.  History of syncope, no recurrences.  No significant palpitations described either.  Cardiac monitor did show multiple, brief episodes of PSVT, the longest of which lasted for about 20 seconds.  Blood pressure has been lower since increasing labetalol.  Plan is to switch to Toprol-XL 25 mg daily and titrate from there. ? ?2.  Essential hypertension.  Continue to track blood pressure at home following switch from labetalol to Toprol-XL. ? ?Medication Adjustments/Labs and Tests Ordered: ?Current medicines are reviewed at length with the patient today.  Concerns regarding medicines are outlined above.  ? ?Tests Ordered: ?No orders of the defined types were placed in this encounter. ? ? ?Medication Changes: ?Meds ordered this encounter  ?Medications  ? metoprolol succinate (TOPROL XL) 25 MG 24 hr tablet  ?  Sig: Take 1 tablet (25 mg total) by mouth daily.  ?  Dispense:  30 tablet  ?  Refill:  6  ?  Stopping Labetalol, med changed 11/29/21  ? ? ?Disposition:  Follow up  3 months. ? ?Signed, ?Jonelle Sidle, MD, [facility] ?11/29/2021 11:46 AM    ?[facility] ?[address] [place], [place], [place] [address] ?Phone: [phone]; Fax: [phone]  ?

## 2022-01-04 DIAGNOSIS — Z6823 Body mass index (BMI) 23.0-23.9, adult: Secondary | ICD-10-CM | POA: Diagnosis not present

## 2022-01-04 DIAGNOSIS — R7309 Other abnormal glucose: Secondary | ICD-10-CM | POA: Diagnosis not present

## 2022-01-04 DIAGNOSIS — G43909 Migraine, unspecified, not intractable, without status migrainosus: Secondary | ICD-10-CM | POA: Diagnosis not present

## 2022-01-04 DIAGNOSIS — F419 Anxiety disorder, unspecified: Secondary | ICD-10-CM | POA: Diagnosis not present

## 2022-01-04 DIAGNOSIS — R202 Paresthesia of skin: Secondary | ICD-10-CM | POA: Diagnosis not present

## 2022-01-04 DIAGNOSIS — Z0001 Encounter for general adult medical examination with abnormal findings: Secondary | ICD-10-CM | POA: Diagnosis not present

## 2022-01-04 DIAGNOSIS — I10 Essential (primary) hypertension: Secondary | ICD-10-CM | POA: Diagnosis not present

## 2022-01-04 DIAGNOSIS — H8109 Meniere's disease, unspecified ear: Secondary | ICD-10-CM | POA: Diagnosis not present

## 2022-01-04 DIAGNOSIS — F329 Major depressive disorder, single episode, unspecified: Secondary | ICD-10-CM | POA: Diagnosis not present

## 2022-03-22 NOTE — Progress Notes (Unsigned)
Cardiology Office Note  Date: 03/23/2022   ID: Patricia Keith, DOB 06-09-1961, MRN [id number]  PCP:  Assunta Found, MD  Cardiologist:  Nona Dell, MD Electrophysiologist:  None   Chief Complaint  Patient presents with   Cardiac follow-up    History of Present Illness: Patricia Keith is a 61 y.o. female last seen in May.  She is here for a follow-up visit.  Reports improved sense of palpitations and is tolerating Toprol-XL 25 mg daily.  Continues to check her blood pressure intermittently at home reporting systolics generally in the 130s to 140s over diastolics in the 70s although sometimes higher.  I reviewed her medications which are otherwise stable and outlined below.  She continues to see Dr. Phillips Odor for primary care.  I rechecked her blood pressure in the left arm today at 132/88.  Did discuss possibility of adding a low-dose diuretic to her beta-blocker.  Past Medical History:  Diagnosis Date   COPD (chronic obstructive pulmonary disease) (HCC)    Hypertension    Meniere disease 2015   Migraines     Past Surgical History:  Procedure Laterality Date   APPENDECTOMY     BACTERIAL OVERGROWTH TEST N/A 01/28/2016   Procedure: BACTERIAL OVERGROWTH TEST;  Surgeon: West Bali, MD;  [facility];  Service: Endoscopy;  Laterality: N/A;  0700   COLONOSCOPY N/A 08/31/2015   SLF: LYMPHOID POLYP, [place] TICS, SML IH     ESOPHAGOGASTRODUODENOSCOPY N/A 04/18/2017   Procedure: ESOPHAGOGASTRODUODENOSCOPY (EGD);  Surgeon: West Bali, MD;  [facility];  Service: Endoscopy;  Laterality: N/A;  145 - pt can not move   SAVORY DILATION N/A 04/18/2017   Procedure: SAVORY DILATION;  Surgeon: West Bali, MD;  [facility];  Service: Endoscopy;  Laterality: N/A;   TONSILLECTOMY      Current Outpatient Medications  Medication Sig Dispense Refill   acetaminophen (TYLENOL) 325 MG tablet Take 650 mg by mouth as needed.     albuterol (VENTOLIN HFA) 108  (90 Base) MCG/ACT inhaler Inhale 2 puffs into the lungs as needed.     atorvastatin (LIPITOR) 20 MG tablet Take 20 mg by mouth daily.     b complex vitamins tablet Take 1 tablet by mouth daily.     calcium carbonate (TUMS - DOSED IN MG ELEMENTAL CALCIUM) 500 MG chewable tablet Chew 2 tablets by mouth 2 (two) times daily as needed for indigestion or heartburn.     cholecalciferol (VITAMIN D3) 25 MCG (1000 UNIT) tablet Take 2,000 Units by mouth daily.     escitalopram (LEXAPRO) 10 MG tablet Take 10 mg by mouth daily.     LORazepam (ATIVAN) 0.5 MG tablet Take 0.5 mg by mouth 2 (two) times daily.     meclizine (ANTIVERT) 25 MG tablet Take 25 mg by mouth every 6 (six) hours as needed for dizziness.     metoprolol succinate (TOPROL XL) 25 MG 24 hr tablet Take 1 tablet (25 mg total) by mouth daily. 30 tablet 6   topiramate (TOPAMAX) 25 MG tablet Take 25 mg by mouth 2 (two) times daily.     No current facility-administered medications for this visit.   Allergies:  Patient has no known allergies.   ROS:  No syncope.  Physical Exam: VS:  BP 132/88 (BP Location: Left Arm, Cuff Size: Normal)   Pulse 62   Ht 5\' 6"  (1.676 m)   Wt 150 lb 3.2 oz (68.1 kg)  SpO2 98%   BMI 24.24 kg/m , BMI Body mass index is 24.24 kg/m.  Wt Readings from Last 3 Encounters:  03/23/22 150 lb 3.2 oz (68.1 kg)  11/29/21 151 lb (68.5 kg)  08/05/21 152 lb 12.8 oz (69.3 kg)    General: Patient appears comfortable at rest. HEENT: Conjunctiva and lids normal. Lungs: Clear to auscultation, nonlabored breathing at rest. Cardiac: Regular rate and rhythm, no S3 or significant systolic murmur, no pericardial rub.  ECG:  An ECG dated 06/07/2021 was personally reviewed today and demonstrated:  Sinus rhythm with borderline prolonged QT interval.  Recent Labwork: 06/07/2021: ALT 31; AST 26; BUN 14; Creatinine, Ser 0.82; Hemoglobin 12.9; Platelets 297; Potassium 3.4; Sodium 138   Other Studies Reviewed Today:  Cardiac monitor  January 2023: ZIO AT reviewed.  13 days, 22 hours analyzed.  Predominant rhythm is sinus with heart rate ranging from 51 bpm up to 132 bpm and average heart rate 67 bpm.   There were rare PACs including couplets and triplets representing less than 1% total beats.  There were also rare PVCs including triplets representing less than 1% total beats.   Multiple, brief episodes of PSVT were noted.  The longest event was approximately 20 seconds with heart rate in the 120s.  No patient triggered events noted.  There were no pauses.  Assessment and Plan:  1.  PSVT by prior cardiac monitoring, also prior history of syncope although without recurrences.  Plan to continue low-dose Toprol-XL 25 mg daily for now given improvement in sense of palpitations.  2.  Essential hypertension.  Continue to track home blood pressure measurements and follow-up with Dr. Phillips Odor.  Addition of a low-dose diuretic such as chlorthalidone or HCTZ to her current regimen would be reasonable next step if necessary.  Watch salt in the diet.  Medication Adjustments/Labs and Tests Ordered: Current medicines are reviewed at length with the patient today.  Concerns regarding medicines are outlined above.   Tests Ordered: No orders of the defined types were placed in this encounter.   Medication Changes: No orders of the defined types were placed in this encounter.   Disposition:  Follow up  1 year.  Signed, Jonelle Sidle, MD, [facility] 03/23/2022 8:33 AM    [facility] at [facility] [address] [place], [place], [place] [address] Phone: [phone]; Fax: [phone]

## 2022-03-23 ENCOUNTER — Ambulatory Visit: Payer: BC Managed Care – PPO | Admitting: Cardiology

## 2022-03-23 ENCOUNTER — Encounter: Payer: Self-pay | Admitting: Cardiology

## 2022-03-23 VITALS — BP 132/88 | HR 62 | Ht 66.0 in | Wt 150.2 lb

## 2022-03-23 DIAGNOSIS — I471 Supraventricular tachycardia: Secondary | ICD-10-CM

## 2022-03-23 DIAGNOSIS — I10 Essential (primary) hypertension: Secondary | ICD-10-CM | POA: Diagnosis not present

## 2022-03-23 NOTE — Patient Instructions (Addendum)

## 2022-06-15 DIAGNOSIS — Z1331 Encounter for screening for depression: Secondary | ICD-10-CM | POA: Diagnosis not present

## 2022-06-15 DIAGNOSIS — Z0001 Encounter for general adult medical examination with abnormal findings: Secondary | ICD-10-CM | POA: Diagnosis not present

## 2022-06-15 DIAGNOSIS — Z6824 Body mass index (BMI) 24.0-24.9, adult: Secondary | ICD-10-CM | POA: Diagnosis not present

## 2022-06-15 DIAGNOSIS — F329 Major depressive disorder, single episode, unspecified: Secondary | ICD-10-CM | POA: Diagnosis not present

## 2022-06-15 DIAGNOSIS — R5383 Other fatigue: Secondary | ICD-10-CM | POA: Diagnosis not present

## 2022-06-15 DIAGNOSIS — I10 Essential (primary) hypertension: Secondary | ICD-10-CM | POA: Diagnosis not present

## 2022-06-15 DIAGNOSIS — Z9229 Personal history of other drug therapy: Secondary | ICD-10-CM | POA: Diagnosis not present

## 2022-06-16 ENCOUNTER — Other Ambulatory Visit: Payer: Self-pay | Admitting: Cardiology

## 2023-03-10 ENCOUNTER — Other Ambulatory Visit: Payer: Self-pay | Admitting: Cardiology

## 2023-04-09 ENCOUNTER — Other Ambulatory Visit: Payer: Self-pay | Admitting: Cardiology

## 2023-08-07 ENCOUNTER — Ambulatory Visit: Payer: Self-pay | Admitting: Cardiology

## 2023-08-08 ENCOUNTER — Encounter: Payer: Self-pay | Admitting: Cardiology

## 2023-08-08 ENCOUNTER — Ambulatory Visit: Payer: Self-pay | Attending: Cardiology | Admitting: Cardiology

## 2023-08-08 VITALS — BP 110/64 | HR 68 | Ht 66.0 in | Wt 173.2 lb

## 2023-08-08 DIAGNOSIS — I10 Essential (primary) hypertension: Secondary | ICD-10-CM

## 2023-08-08 DIAGNOSIS — I471 Supraventricular tachycardia, unspecified: Secondary | ICD-10-CM

## 2023-08-08 NOTE — Patient Instructions (Signed)

## 2023-08-08 NOTE — Progress Notes (Signed)
    Cardiology Office Note  Date: 08/08/2023   ID: Patricia Keith, DOB Dec 13, 1960, MRN [id number]  History of Present Illness: Patricia Keith is a 63 y.o. female last seen in August 2023.  She is here for a routine visit.  Reports intermittent palpitations as before, no increasing pattern and no prolonged events.  No dizziness or syncope.  Reviewed her medications.  Current regimen includes Toprol -XL, HCTZ, and Lipitor.  Blood pressure is well-controlled today.  I reviewed her lab work from November 2024.  I reviewed her ECG today which shows normal sinus rhythm.  Physical Exam: VS:  BP 110/64   Ht 5' 6 (1.676 m)   Wt 173 lb 3.2 oz (78.6 kg)   SpO2 98%   BMI 27.96 kg/m , BMI Body mass index is 27.96 kg/m.  Wt Readings from Last 3 Encounters:  08/08/23 173 lb 3.2 oz (78.6 kg)  03/23/22 150 lb 3.2 oz (68.1 kg)  11/29/21 151 lb (68.5 kg)    General: Patient appears comfortable at rest. HEENT: Conjunctiva and lids normal. Neck: Supple, no elevated JVP. Lungs: Clear to auscultation, nonlabored breathing at rest. Cardiac: Regular rate and rhythm, no S3 or significant systolic murmur, no pericardial rub.  ECG:  An ECG dated 06/07/2021 was personally reviewed today and demonstrated:  Sinus rhythm with borderline prolonged QT interval.  Labwork:  November 2024: Hemoglobin 13.9, platelets 268, BUN 15, creatinine 0.7, potassium 4.3, AST 19, ALT 13, cholesterol 196, triglycerides 212, HDL 37, LDL 121, TSH 2.76  Other Studies Reviewed Today:  No interval cardiac testing for review today.  Assessment and Plan:  1.  PSVT by prior cardiac monitoring, also prior history of syncope although without recurrences.  She reports stable, intermittent palpitations without increasing frequency or duration.  Plan to continue Toprol -XL and observation.   2.  Primary hypertension.  Blood pressure is well-controlled today.  Continue HCTZ along with Toprol -XL.  Disposition:  Follow up  1  year.  Signed, Jayson JUDITHANN Sierras, M.D., F.A.C.C. [facility] at [facility]

## 2023-09-28 ENCOUNTER — Other Ambulatory Visit: Payer: Self-pay | Admitting: Cardiology

## 2024-06-01 ENCOUNTER — Other Ambulatory Visit: Payer: Self-pay | Admitting: Cardiology

## 2024-09-24 ENCOUNTER — Ambulatory Visit: Payer: Self-pay | Admitting: Cardiology
